# Patient Record
Sex: Male | Born: 1970 | Race: White | Hispanic: No | Smoking: Current every day smoker
Health system: Southern US, Community
[De-identification: ages and names within clinical notes are randomized; demographics above are authoritative.]

## PROBLEM LIST (undated history)

## (undated) DIAGNOSIS — F4310 Post-traumatic stress disorder, unspecified: Secondary | ICD-10-CM

## (undated) DIAGNOSIS — R413 Other amnesia: Secondary | ICD-10-CM

## (undated) DIAGNOSIS — Z87442 Personal history of urinary calculi: Secondary | ICD-10-CM

## (undated) DIAGNOSIS — E119 Type 2 diabetes mellitus without complications: Secondary | ICD-10-CM

## (undated) DIAGNOSIS — F419 Anxiety disorder, unspecified: Secondary | ICD-10-CM

## (undated) DIAGNOSIS — S069XAA Unspecified intracranial injury with loss of consciousness status unknown, initial encounter: Secondary | ICD-10-CM

## (undated) DIAGNOSIS — L403 Pustulosis palmaris et plantaris: Secondary | ICD-10-CM

## (undated) DIAGNOSIS — K219 Gastro-esophageal reflux disease without esophagitis: Secondary | ICD-10-CM

## (undated) DIAGNOSIS — D649 Anemia, unspecified: Secondary | ICD-10-CM

## (undated) DIAGNOSIS — Z972 Presence of dental prosthetic device (complete) (partial): Secondary | ICD-10-CM

## (undated) DIAGNOSIS — R2681 Unsteadiness on feet: Secondary | ICD-10-CM

## (undated) DIAGNOSIS — C4490 Unspecified malignant neoplasm of skin, unspecified: Secondary | ICD-10-CM

## (undated) DIAGNOSIS — G40409 Other generalized epilepsy and epileptic syndromes, not intractable, without status epilepticus: Secondary | ICD-10-CM

## (undated) DIAGNOSIS — R2689 Other abnormalities of gait and mobility: Secondary | ICD-10-CM

## (undated) DIAGNOSIS — S069X9A Unspecified intracranial injury with loss of consciousness of unspecified duration, initial encounter: Secondary | ICD-10-CM

## (undated) DIAGNOSIS — G40A01 Absence epileptic syndrome, not intractable, with status epilepticus: Secondary | ICD-10-CM

## (undated) DIAGNOSIS — G43909 Migraine, unspecified, not intractable, without status migrainosus: Secondary | ICD-10-CM

## (undated) DIAGNOSIS — S04019A Injury of optic nerve, unspecified eye, initial encounter: Secondary | ICD-10-CM

## (undated) HISTORY — PX: BRAIN SURGERY: SHX531

## (undated) HISTORY — PX: FACIAL RECONSTRUCTION SURGERY: SHX631

---

## 2012-07-08 DIAGNOSIS — C4490 Unspecified malignant neoplasm of skin, unspecified: Secondary | ICD-10-CM

## 2012-07-08 HISTORY — PX: SKIN CANCER EXCISION: SHX779

## 2012-07-08 HISTORY — DX: Unspecified malignant neoplasm of skin, unspecified: C44.90

## 2015-12-07 DIAGNOSIS — G40309 Generalized idiopathic epilepsy and epileptic syndromes, not intractable, without status epilepticus: Secondary | ICD-10-CM | POA: Diagnosis not present

## 2015-12-07 DIAGNOSIS — E119 Type 2 diabetes mellitus without complications: Secondary | ICD-10-CM | POA: Diagnosis not present

## 2015-12-07 DIAGNOSIS — F325 Major depressive disorder, single episode, in full remission: Secondary | ICD-10-CM | POA: Diagnosis not present

## 2015-12-07 DIAGNOSIS — E782 Mixed hyperlipidemia: Secondary | ICD-10-CM | POA: Diagnosis not present

## 2015-12-07 DIAGNOSIS — E784 Other hyperlipidemia: Secondary | ICD-10-CM | POA: Diagnosis not present

## 2016-03-18 DIAGNOSIS — G40309 Generalized idiopathic epilepsy and epileptic syndromes, not intractable, without status epilepticus: Secondary | ICD-10-CM | POA: Diagnosis not present

## 2016-03-18 DIAGNOSIS — E119 Type 2 diabetes mellitus without complications: Secondary | ICD-10-CM | POA: Diagnosis not present

## 2016-03-18 DIAGNOSIS — E784 Other hyperlipidemia: Secondary | ICD-10-CM | POA: Diagnosis not present

## 2016-03-18 DIAGNOSIS — E782 Mixed hyperlipidemia: Secondary | ICD-10-CM | POA: Diagnosis not present

## 2016-03-18 DIAGNOSIS — F325 Major depressive disorder, single episode, in full remission: Secondary | ICD-10-CM | POA: Diagnosis not present

## 2016-03-18 DIAGNOSIS — Z23 Encounter for immunization: Secondary | ICD-10-CM | POA: Diagnosis not present

## 2016-05-22 DIAGNOSIS — J060 Acute laryngopharyngitis: Secondary | ICD-10-CM | POA: Diagnosis not present

## 2016-08-13 DIAGNOSIS — R0602 Shortness of breath: Secondary | ICD-10-CM | POA: Diagnosis not present

## 2016-08-13 DIAGNOSIS — F17218 Nicotine dependence, cigarettes, with other nicotine-induced disorders: Secondary | ICD-10-CM | POA: Diagnosis not present

## 2016-10-22 DIAGNOSIS — R21 Rash and other nonspecific skin eruption: Secondary | ICD-10-CM | POA: Diagnosis not present

## 2017-01-14 DIAGNOSIS — R221 Localized swelling, mass and lump, neck: Secondary | ICD-10-CM | POA: Diagnosis not present

## 2017-01-14 DIAGNOSIS — K119 Disease of salivary gland, unspecified: Secondary | ICD-10-CM | POA: Diagnosis not present

## 2017-01-20 DIAGNOSIS — K029 Dental caries, unspecified: Secondary | ICD-10-CM | POA: Diagnosis not present

## 2017-01-20 DIAGNOSIS — J342 Deviated nasal septum: Secondary | ICD-10-CM | POA: Diagnosis not present

## 2017-01-20 DIAGNOSIS — H6121 Impacted cerumen, right ear: Secondary | ICD-10-CM | POA: Diagnosis not present

## 2017-01-20 DIAGNOSIS — R221 Localized swelling, mass and lump, neck: Secondary | ICD-10-CM | POA: Diagnosis not present

## 2017-01-20 DIAGNOSIS — F17200 Nicotine dependence, unspecified, uncomplicated: Secondary | ICD-10-CM | POA: Diagnosis not present

## 2017-01-20 DIAGNOSIS — D37030 Neoplasm of uncertain behavior of the parotid salivary glands: Secondary | ICD-10-CM | POA: Diagnosis not present

## 2017-02-03 DIAGNOSIS — K118 Other diseases of salivary glands: Secondary | ICD-10-CM | POA: Diagnosis not present

## 2017-02-03 DIAGNOSIS — K119 Disease of salivary gland, unspecified: Secondary | ICD-10-CM | POA: Diagnosis not present

## 2017-02-03 DIAGNOSIS — D37030 Neoplasm of uncertain behavior of the parotid salivary glands: Secondary | ICD-10-CM | POA: Diagnosis not present

## 2017-02-25 DIAGNOSIS — D119 Benign neoplasm of major salivary gland, unspecified: Secondary | ICD-10-CM | POA: Diagnosis not present

## 2017-02-25 DIAGNOSIS — F17200 Nicotine dependence, unspecified, uncomplicated: Secondary | ICD-10-CM | POA: Diagnosis not present

## 2017-03-05 DIAGNOSIS — T7840XA Allergy, unspecified, initial encounter: Secondary | ICD-10-CM | POA: Diagnosis not present

## 2017-03-06 DIAGNOSIS — D119 Benign neoplasm of major salivary gland, unspecified: Secondary | ICD-10-CM | POA: Diagnosis not present

## 2017-03-12 DIAGNOSIS — Z7721 Contact with and (suspected) exposure to potentially hazardous body fluids: Secondary | ICD-10-CM | POA: Diagnosis not present

## 2017-03-12 DIAGNOSIS — Z202 Contact with and (suspected) exposure to infections with a predominantly sexual mode of transmission: Secondary | ICD-10-CM | POA: Diagnosis not present

## 2017-03-12 DIAGNOSIS — R21 Rash and other nonspecific skin eruption: Secondary | ICD-10-CM | POA: Diagnosis not present

## 2017-03-12 DIAGNOSIS — Z23 Encounter for immunization: Secondary | ICD-10-CM | POA: Diagnosis not present

## 2017-03-31 DIAGNOSIS — E119 Type 2 diabetes mellitus without complications: Secondary | ICD-10-CM | POA: Diagnosis not present

## 2017-03-31 DIAGNOSIS — E782 Mixed hyperlipidemia: Secondary | ICD-10-CM | POA: Diagnosis not present

## 2017-03-31 DIAGNOSIS — R799 Abnormal finding of blood chemistry, unspecified: Secondary | ICD-10-CM | POA: Diagnosis not present

## 2017-03-31 DIAGNOSIS — R5383 Other fatigue: Secondary | ICD-10-CM | POA: Diagnosis not present

## 2017-03-31 DIAGNOSIS — F325 Major depressive disorder, single episode, in full remission: Secondary | ICD-10-CM | POA: Diagnosis not present

## 2017-03-31 DIAGNOSIS — G40309 Generalized idiopathic epilepsy and epileptic syndromes, not intractable, without status epilepticus: Secondary | ICD-10-CM | POA: Diagnosis not present

## 2017-04-11 DIAGNOSIS — D119 Benign neoplasm of major salivary gland, unspecified: Secondary | ICD-10-CM | POA: Diagnosis not present

## 2017-04-11 DIAGNOSIS — F17200 Nicotine dependence, unspecified, uncomplicated: Secondary | ICD-10-CM | POA: Diagnosis not present

## 2017-04-15 DIAGNOSIS — R799 Abnormal finding of blood chemistry, unspecified: Secondary | ICD-10-CM | POA: Diagnosis not present

## 2017-04-15 DIAGNOSIS — E119 Type 2 diabetes mellitus without complications: Secondary | ICD-10-CM | POA: Diagnosis not present

## 2017-04-15 DIAGNOSIS — E782 Mixed hyperlipidemia: Secondary | ICD-10-CM | POA: Diagnosis not present

## 2017-04-25 DIAGNOSIS — J069 Acute upper respiratory infection, unspecified: Secondary | ICD-10-CM | POA: Diagnosis not present

## 2017-04-25 DIAGNOSIS — J209 Acute bronchitis, unspecified: Secondary | ICD-10-CM | POA: Diagnosis not present

## 2017-04-25 DIAGNOSIS — J029 Acute pharyngitis, unspecified: Secondary | ICD-10-CM | POA: Diagnosis not present

## 2017-05-05 DIAGNOSIS — D119 Benign neoplasm of major salivary gland, unspecified: Secondary | ICD-10-CM | POA: Diagnosis not present

## 2017-05-05 DIAGNOSIS — F17200 Nicotine dependence, unspecified, uncomplicated: Secondary | ICD-10-CM | POA: Diagnosis not present

## 2017-05-08 DIAGNOSIS — R05 Cough: Secondary | ICD-10-CM | POA: Diagnosis not present

## 2017-05-08 DIAGNOSIS — J0100 Acute maxillary sinusitis, unspecified: Secondary | ICD-10-CM | POA: Diagnosis not present

## 2017-05-08 HISTORY — PX: FACIAL BONE TUMOR EXCISION: SUR565

## 2017-05-14 DIAGNOSIS — D110 Benign neoplasm of parotid gland: Secondary | ICD-10-CM | POA: Diagnosis not present

## 2017-05-14 DIAGNOSIS — D490 Neoplasm of unspecified behavior of digestive system: Secondary | ICD-10-CM | POA: Diagnosis not present

## 2017-05-14 DIAGNOSIS — F17210 Nicotine dependence, cigarettes, uncomplicated: Secondary | ICD-10-CM | POA: Diagnosis not present

## 2017-05-14 DIAGNOSIS — D119 Benign neoplasm of major salivary gland, unspecified: Secondary | ICD-10-CM | POA: Diagnosis not present

## 2017-05-28 DIAGNOSIS — Z87820 Personal history of traumatic brain injury: Secondary | ICD-10-CM | POA: Diagnosis not present

## 2017-05-28 DIAGNOSIS — S04011A Injury of optic nerve, right eye, initial encounter: Secondary | ICD-10-CM | POA: Diagnosis not present

## 2017-05-28 DIAGNOSIS — H52223 Regular astigmatism, bilateral: Secondary | ICD-10-CM | POA: Diagnosis not present

## 2017-05-28 DIAGNOSIS — H5213 Myopia, bilateral: Secondary | ICD-10-CM | POA: Diagnosis not present

## 2017-05-28 DIAGNOSIS — S04012A Injury of optic nerve, left eye, initial encounter: Secondary | ICD-10-CM | POA: Diagnosis not present

## 2017-05-28 DIAGNOSIS — H524 Presbyopia: Secondary | ICD-10-CM | POA: Diagnosis not present

## 2017-06-09 DIAGNOSIS — F4312 Post-traumatic stress disorder, chronic: Secondary | ICD-10-CM | POA: Diagnosis not present

## 2017-06-09 DIAGNOSIS — K047 Periapical abscess without sinus: Secondary | ICD-10-CM | POA: Diagnosis not present

## 2017-06-26 DIAGNOSIS — R561 Post traumatic seizures: Secondary | ICD-10-CM | POA: Diagnosis not present

## 2017-06-26 DIAGNOSIS — F4310 Post-traumatic stress disorder, unspecified: Secondary | ICD-10-CM | POA: Diagnosis not present

## 2017-06-26 DIAGNOSIS — Z87820 Personal history of traumatic brain injury: Secondary | ICD-10-CM | POA: Diagnosis not present

## 2017-06-26 DIAGNOSIS — G4700 Insomnia, unspecified: Secondary | ICD-10-CM | POA: Diagnosis not present

## 2017-06-26 DIAGNOSIS — F419 Anxiety disorder, unspecified: Secondary | ICD-10-CM | POA: Diagnosis not present

## 2017-07-02 DIAGNOSIS — G40209 Localization-related (focal) (partial) symptomatic epilepsy and epileptic syndromes with complex partial seizures, not intractable, without status epilepticus: Secondary | ICD-10-CM | POA: Diagnosis not present

## 2017-07-02 DIAGNOSIS — G9349 Other encephalopathy: Secondary | ICD-10-CM | POA: Diagnosis not present

## 2017-07-02 DIAGNOSIS — S069X9S Unspecified intracranial injury with loss of consciousness of unspecified duration, sequela: Secondary | ICD-10-CM | POA: Diagnosis not present

## 2017-07-17 DIAGNOSIS — A539 Syphilis, unspecified: Secondary | ICD-10-CM | POA: Diagnosis not present

## 2017-07-17 DIAGNOSIS — Z1379 Encounter for other screening for genetic and chromosomal anomalies: Secondary | ICD-10-CM | POA: Diagnosis not present

## 2017-07-17 DIAGNOSIS — F419 Anxiety disorder, unspecified: Secondary | ICD-10-CM | POA: Diagnosis not present

## 2017-07-17 DIAGNOSIS — F909 Attention-deficit hyperactivity disorder, unspecified type: Secondary | ICD-10-CM | POA: Diagnosis not present

## 2017-07-17 DIAGNOSIS — F339 Major depressive disorder, recurrent, unspecified: Secondary | ICD-10-CM | POA: Diagnosis not present

## 2017-07-17 DIAGNOSIS — Z1159 Encounter for screening for other viral diseases: Secondary | ICD-10-CM | POA: Diagnosis not present

## 2017-07-22 DIAGNOSIS — I8310 Varicose veins of unspecified lower extremity with inflammation: Secondary | ICD-10-CM | POA: Diagnosis not present

## 2017-07-22 DIAGNOSIS — L578 Other skin changes due to chronic exposure to nonionizing radiation: Secondary | ICD-10-CM | POA: Diagnosis not present

## 2017-07-22 DIAGNOSIS — Z1379 Encounter for other screening for genetic and chromosomal anomalies: Secondary | ICD-10-CM | POA: Diagnosis not present

## 2017-07-22 DIAGNOSIS — L400 Psoriasis vulgaris: Secondary | ICD-10-CM | POA: Diagnosis not present

## 2017-07-22 DIAGNOSIS — Z8042 Family history of malignant neoplasm of prostate: Secondary | ICD-10-CM | POA: Diagnosis not present

## 2017-07-22 DIAGNOSIS — G4700 Insomnia, unspecified: Secondary | ICD-10-CM | POA: Diagnosis not present

## 2017-07-22 DIAGNOSIS — L401 Generalized pustular psoriasis: Secondary | ICD-10-CM | POA: Diagnosis not present

## 2017-07-22 DIAGNOSIS — F419 Anxiety disorder, unspecified: Secondary | ICD-10-CM | POA: Diagnosis not present

## 2017-07-22 DIAGNOSIS — R561 Post traumatic seizures: Secondary | ICD-10-CM | POA: Diagnosis not present

## 2017-07-22 DIAGNOSIS — Z803 Family history of malignant neoplasm of breast: Secondary | ICD-10-CM | POA: Diagnosis not present

## 2017-07-25 DIAGNOSIS — G9349 Other encephalopathy: Secondary | ICD-10-CM | POA: Diagnosis not present

## 2017-07-25 DIAGNOSIS — G40209 Localization-related (focal) (partial) symptomatic epilepsy and epileptic syndromes with complex partial seizures, not intractable, without status epilepticus: Secondary | ICD-10-CM | POA: Diagnosis not present

## 2017-07-25 DIAGNOSIS — S069X9S Unspecified intracranial injury with loss of consciousness of unspecified duration, sequela: Secondary | ICD-10-CM | POA: Diagnosis not present

## 2017-07-30 DIAGNOSIS — F422 Mixed obsessional thoughts and acts: Secondary | ICD-10-CM | POA: Diagnosis not present

## 2017-08-07 DIAGNOSIS — R079 Chest pain, unspecified: Secondary | ICD-10-CM | POA: Diagnosis not present

## 2017-08-07 DIAGNOSIS — R05 Cough: Secondary | ICD-10-CM | POA: Diagnosis not present

## 2017-08-07 DIAGNOSIS — M25549 Pain in joints of unspecified hand: Secondary | ICD-10-CM | POA: Diagnosis not present

## 2017-08-07 DIAGNOSIS — L401 Generalized pustular psoriasis: Secondary | ICD-10-CM | POA: Diagnosis not present

## 2017-08-07 DIAGNOSIS — F17210 Nicotine dependence, cigarettes, uncomplicated: Secondary | ICD-10-CM | POA: Diagnosis not present

## 2017-08-19 DIAGNOSIS — F17210 Nicotine dependence, cigarettes, uncomplicated: Secondary | ICD-10-CM | POA: Diagnosis not present

## 2017-08-19 DIAGNOSIS — Z0001 Encounter for general adult medical examination with abnormal findings: Secondary | ICD-10-CM | POA: Diagnosis not present

## 2017-08-19 DIAGNOSIS — R531 Weakness: Secondary | ICD-10-CM | POA: Diagnosis not present

## 2017-08-19 DIAGNOSIS — R5383 Other fatigue: Secondary | ICD-10-CM | POA: Diagnosis not present

## 2017-08-19 DIAGNOSIS — E1165 Type 2 diabetes mellitus with hyperglycemia: Secondary | ICD-10-CM | POA: Diagnosis not present

## 2017-08-19 DIAGNOSIS — R05 Cough: Secondary | ICD-10-CM | POA: Diagnosis not present

## 2017-08-19 DIAGNOSIS — F4310 Post-traumatic stress disorder, unspecified: Secondary | ICD-10-CM | POA: Diagnosis not present

## 2017-08-19 DIAGNOSIS — F419 Anxiety disorder, unspecified: Secondary | ICD-10-CM | POA: Diagnosis not present

## 2017-08-20 DIAGNOSIS — E1165 Type 2 diabetes mellitus with hyperglycemia: Secondary | ICD-10-CM | POA: Diagnosis not present

## 2017-08-26 DIAGNOSIS — L400 Psoriasis vulgaris: Secondary | ICD-10-CM | POA: Diagnosis not present

## 2017-08-26 DIAGNOSIS — L570 Actinic keratosis: Secondary | ICD-10-CM | POA: Diagnosis not present

## 2017-08-27 DIAGNOSIS — F422 Mixed obsessional thoughts and acts: Secondary | ICD-10-CM | POA: Diagnosis not present

## 2017-09-04 DIAGNOSIS — D509 Iron deficiency anemia, unspecified: Secondary | ICD-10-CM | POA: Diagnosis not present

## 2017-09-04 DIAGNOSIS — E1165 Type 2 diabetes mellitus with hyperglycemia: Secondary | ICD-10-CM | POA: Diagnosis not present

## 2017-09-04 DIAGNOSIS — D519 Vitamin B12 deficiency anemia, unspecified: Secondary | ICD-10-CM | POA: Diagnosis not present

## 2017-09-17 DIAGNOSIS — S04012A Injury of optic nerve, left eye, initial encounter: Secondary | ICD-10-CM | POA: Diagnosis not present

## 2017-09-17 DIAGNOSIS — Z87820 Personal history of traumatic brain injury: Secondary | ICD-10-CM | POA: Diagnosis not present

## 2017-09-17 DIAGNOSIS — H53453 Other localized visual field defect, bilateral: Secondary | ICD-10-CM | POA: Diagnosis not present

## 2017-09-17 DIAGNOSIS — H5203 Hypermetropia, bilateral: Secondary | ICD-10-CM | POA: Diagnosis not present

## 2017-09-17 DIAGNOSIS — S04011A Injury of optic nerve, right eye, initial encounter: Secondary | ICD-10-CM | POA: Diagnosis not present

## 2017-09-17 DIAGNOSIS — H52223 Regular astigmatism, bilateral: Secondary | ICD-10-CM | POA: Diagnosis not present

## 2017-09-17 DIAGNOSIS — H524 Presbyopia: Secondary | ICD-10-CM | POA: Diagnosis not present

## 2017-09-18 DIAGNOSIS — E1165 Type 2 diabetes mellitus with hyperglycemia: Secondary | ICD-10-CM | POA: Diagnosis not present

## 2017-09-18 DIAGNOSIS — R799 Abnormal finding of blood chemistry, unspecified: Secondary | ICD-10-CM | POA: Diagnosis not present

## 2017-09-29 DIAGNOSIS — F422 Mixed obsessional thoughts and acts: Secondary | ICD-10-CM | POA: Diagnosis not present

## 2017-10-06 DIAGNOSIS — F422 Mixed obsessional thoughts and acts: Secondary | ICD-10-CM | POA: Diagnosis not present

## 2017-10-30 DIAGNOSIS — Z87820 Personal history of traumatic brain injury: Secondary | ICD-10-CM | POA: Diagnosis not present

## 2017-10-30 DIAGNOSIS — R569 Unspecified convulsions: Secondary | ICD-10-CM | POA: Diagnosis not present

## 2017-10-30 DIAGNOSIS — F17210 Nicotine dependence, cigarettes, uncomplicated: Secondary | ICD-10-CM | POA: Diagnosis not present

## 2017-10-30 DIAGNOSIS — Z79899 Other long term (current) drug therapy: Secondary | ICD-10-CM | POA: Diagnosis not present

## 2017-11-10 DIAGNOSIS — F422 Mixed obsessional thoughts and acts: Secondary | ICD-10-CM | POA: Diagnosis not present

## 2017-11-25 DIAGNOSIS — L570 Actinic keratosis: Secondary | ICD-10-CM | POA: Diagnosis not present

## 2017-11-25 DIAGNOSIS — L720 Epidermal cyst: Secondary | ICD-10-CM | POA: Diagnosis not present

## 2017-11-25 DIAGNOSIS — L400 Psoriasis vulgaris: Secondary | ICD-10-CM | POA: Diagnosis not present

## 2017-11-25 DIAGNOSIS — B079 Viral wart, unspecified: Secondary | ICD-10-CM | POA: Diagnosis not present

## 2017-12-06 HISTORY — PX: DENTAL SURGERY: SHX609

## 2018-01-27 DIAGNOSIS — R05 Cough: Secondary | ICD-10-CM | POA: Diagnosis not present

## 2018-01-27 DIAGNOSIS — E119 Type 2 diabetes mellitus without complications: Secondary | ICD-10-CM | POA: Diagnosis not present

## 2018-01-27 DIAGNOSIS — E785 Hyperlipidemia, unspecified: Secondary | ICD-10-CM | POA: Diagnosis not present

## 2018-01-27 DIAGNOSIS — R634 Abnormal weight loss: Secondary | ICD-10-CM | POA: Diagnosis not present

## 2018-01-27 DIAGNOSIS — Z87820 Personal history of traumatic brain injury: Secondary | ICD-10-CM | POA: Diagnosis not present

## 2018-01-27 DIAGNOSIS — F419 Anxiety disorder, unspecified: Secondary | ICD-10-CM | POA: Diagnosis not present

## 2018-01-27 DIAGNOSIS — Z125 Encounter for screening for malignant neoplasm of prostate: Secondary | ICD-10-CM | POA: Diagnosis not present

## 2018-02-02 DIAGNOSIS — F422 Mixed obsessional thoughts and acts: Secondary | ICD-10-CM | POA: Diagnosis not present

## 2018-02-26 DIAGNOSIS — E119 Type 2 diabetes mellitus without complications: Secondary | ICD-10-CM | POA: Diagnosis not present

## 2018-02-26 DIAGNOSIS — E871 Hypo-osmolality and hyponatremia: Secondary | ICD-10-CM | POA: Diagnosis not present

## 2018-03-05 DIAGNOSIS — F17210 Nicotine dependence, cigarettes, uncomplicated: Secondary | ICD-10-CM | POA: Diagnosis not present

## 2018-03-05 DIAGNOSIS — Z9049 Acquired absence of other specified parts of digestive tract: Secondary | ICD-10-CM | POA: Diagnosis not present

## 2018-03-19 DIAGNOSIS — R100 Acute abdomen: Secondary | ICD-10-CM | POA: Diagnosis not present

## 2018-03-19 DIAGNOSIS — K5900 Constipation, unspecified: Secondary | ICD-10-CM | POA: Diagnosis not present

## 2018-03-23 DIAGNOSIS — Z79899 Other long term (current) drug therapy: Secondary | ICD-10-CM | POA: Diagnosis not present

## 2018-03-23 DIAGNOSIS — E119 Type 2 diabetes mellitus without complications: Secondary | ICD-10-CM | POA: Diagnosis not present

## 2018-03-23 DIAGNOSIS — F17210 Nicotine dependence, cigarettes, uncomplicated: Secondary | ICD-10-CM | POA: Diagnosis not present

## 2018-03-23 DIAGNOSIS — N200 Calculus of kidney: Secondary | ICD-10-CM | POA: Diagnosis not present

## 2018-03-23 DIAGNOSIS — N201 Calculus of ureter: Secondary | ICD-10-CM | POA: Diagnosis not present

## 2018-03-24 DIAGNOSIS — N200 Calculus of kidney: Secondary | ICD-10-CM | POA: Diagnosis not present

## 2018-03-25 ENCOUNTER — Other Ambulatory Visit: Payer: Self-pay | Admitting: Urology

## 2018-04-08 ENCOUNTER — Encounter (HOSPITAL_BASED_OUTPATIENT_CLINIC_OR_DEPARTMENT_OTHER): Payer: Self-pay

## 2018-04-08 ENCOUNTER — Other Ambulatory Visit: Payer: Self-pay

## 2018-04-08 NOTE — Pre-Procedure Instructions (Signed)
Left a voicemail with Pam at Dr. Carlton Adam office to inform them of the several unsuccessful attempts to reach Randy Richards for pre op interview.  I wanted to see if they had any additional phone numbers I could reach him.

## 2018-04-08 NOTE — Progress Notes (Signed)
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

## 2018-04-09 ENCOUNTER — Ambulatory Visit (HOSPITAL_BASED_OUTPATIENT_CLINIC_OR_DEPARTMENT_OTHER)
Admission: RE | Admit: 2018-04-09 | Discharge: 2018-04-09 | Disposition: A | Discharge status: Home / Self Care | Payer: PPO | Source: Ambulatory Visit | Attending: Urology | Admitting: Urology

## 2018-04-09 ENCOUNTER — Ambulatory Visit (HOSPITAL_BASED_OUTPATIENT_CLINIC_OR_DEPARTMENT_OTHER): Payer: PPO | Admitting: Anesthesiology

## 2018-04-09 ENCOUNTER — Encounter (HOSPITAL_BASED_OUTPATIENT_CLINIC_OR_DEPARTMENT_OTHER): Payer: Self-pay

## 2018-04-09 ENCOUNTER — Encounter (HOSPITAL_BASED_OUTPATIENT_CLINIC_OR_DEPARTMENT_OTHER)
Admission: RE | Disposition: A | Discharge status: Home / Self Care | Payer: Self-pay | Source: Ambulatory Visit | Attending: Urology

## 2018-04-09 DIAGNOSIS — E119 Type 2 diabetes mellitus without complications: Secondary | ICD-10-CM | POA: Insufficient documentation

## 2018-04-09 DIAGNOSIS — F329 Major depressive disorder, single episode, unspecified: Secondary | ICD-10-CM | POA: Diagnosis not present

## 2018-04-09 DIAGNOSIS — Z882 Allergy status to sulfonamides status: Secondary | ICD-10-CM | POA: Insufficient documentation

## 2018-04-09 DIAGNOSIS — F419 Anxiety disorder, unspecified: Secondary | ICD-10-CM | POA: Insufficient documentation

## 2018-04-09 DIAGNOSIS — M1990 Unspecified osteoarthritis, unspecified site: Secondary | ICD-10-CM | POA: Diagnosis not present

## 2018-04-09 DIAGNOSIS — N202 Calculus of kidney with calculus of ureter: Secondary | ICD-10-CM | POA: Diagnosis present

## 2018-04-09 DIAGNOSIS — Z8042 Family history of malignant neoplasm of prostate: Secondary | ICD-10-CM | POA: Insufficient documentation

## 2018-04-09 DIAGNOSIS — G40909 Epilepsy, unspecified, not intractable, without status epilepticus: Secondary | ICD-10-CM | POA: Diagnosis not present

## 2018-04-09 DIAGNOSIS — D649 Anemia, unspecified: Secondary | ICD-10-CM | POA: Diagnosis not present

## 2018-04-09 DIAGNOSIS — N200 Calculus of kidney: Secondary | ICD-10-CM | POA: Diagnosis not present

## 2018-04-09 DIAGNOSIS — Z7982 Long term (current) use of aspirin: Secondary | ICD-10-CM | POA: Insufficient documentation

## 2018-04-09 DIAGNOSIS — Z841 Family history of disorders of kidney and ureter: Secondary | ICD-10-CM | POA: Insufficient documentation

## 2018-04-09 DIAGNOSIS — Z79899 Other long term (current) drug therapy: Secondary | ICD-10-CM | POA: Diagnosis not present

## 2018-04-09 HISTORY — DX: Post-traumatic stress disorder, unspecified: F43.10

## 2018-04-09 HISTORY — DX: Pustulosis palmaris et plantaris: L40.3

## 2018-04-09 HISTORY — DX: Anemia, unspecified: D64.9

## 2018-04-09 HISTORY — DX: Anxiety disorder, unspecified: F41.9

## 2018-04-09 HISTORY — DX: Migraine, unspecified, not intractable, without status migrainosus: G43.909

## 2018-04-09 HISTORY — DX: Unspecified malignant neoplasm of skin, unspecified: C44.90

## 2018-04-09 HISTORY — DX: Unspecified intracranial injury with loss of consciousness status unknown, initial encounter: S06.9XAA

## 2018-04-09 HISTORY — DX: Personal history of urinary calculi: Z87.442

## 2018-04-09 HISTORY — DX: Unsteadiness on feet: R26.81

## 2018-04-09 HISTORY — DX: Presence of dental prosthetic device (complete) (partial): Z97.2

## 2018-04-09 HISTORY — DX: Gastro-esophageal reflux disease without esophagitis: K21.9

## 2018-04-09 HISTORY — DX: Type 2 diabetes mellitus without complications: E11.9

## 2018-04-09 HISTORY — DX: Other generalized epilepsy and epileptic syndromes, not intractable, without status epilepticus: G40.409

## 2018-04-09 HISTORY — DX: Other amnesia: R41.3

## 2018-04-09 HISTORY — DX: Absence epileptic syndrome, not intractable, with status epilepticus: G40.A01

## 2018-04-09 HISTORY — DX: Unspecified intracranial injury with loss of consciousness of unspecified duration, initial encounter: S06.9X9A

## 2018-04-09 HISTORY — DX: Injury of optic nerve, unspecified eye, initial encounter: S04.019A

## 2018-04-09 HISTORY — DX: Other abnormalities of gait and mobility: R26.89

## 2018-04-09 HISTORY — PX: CYSTOSCOPY WITH RETROGRADE PYELOGRAM, URETEROSCOPY AND STENT PLACEMENT: SHX5789

## 2018-04-09 LAB — POCT I-STAT 4, (NA,K, GLUC, HGB,HCT)
Glucose, Bld: 116 mg/dL — ABNORMAL HIGH (ref 70–99)
HEMATOCRIT: 39 % (ref 39.0–52.0)
Hemoglobin: 13.3 g/dL (ref 13.0–17.0)
Potassium: 4.6 mmol/L (ref 3.5–5.1)
Sodium: 134 mmol/L — ABNORMAL LOW (ref 135–145)

## 2018-04-09 LAB — GLUCOSE, CAPILLARY: Glucose-Capillary: 129 mg/dL — ABNORMAL HIGH (ref 70–99)

## 2018-04-09 SURGERY — CYSTOURETEROSCOPY, WITH RETROGRADE PYELOGRAM AND STENT INSERTION
Anesthesia: General | Site: Bladder | Laterality: Right

## 2018-04-09 MED ORDER — LIDOCAINE HCL (CARDIAC) PF 100 MG/5ML IV SOSY
PREFILLED_SYRINGE | INTRAVENOUS | Status: DC | PRN
  Administered 2018-04-09: 100 mg via INTRAVENOUS

## 2018-04-09 MED ORDER — PROMETHAZINE HCL 25 MG/ML IJ SOLN
6.2500 mg | INTRAMUSCULAR | Status: DC | PRN
  Filled 2018-04-09: qty 1

## 2018-04-09 MED ORDER — PHENAZOPYRIDINE HCL 200 MG PO TABS: 200.0000 mg | ORAL_TABLET | Freq: Three times a day (TID) | ORAL | 0 refills | Status: DC | PRN

## 2018-04-09 MED ORDER — IOHEXOL 300 MG/ML  SOLN
INTRAMUSCULAR | Status: DC | PRN
  Administered 2018-04-09: 10 mL via INTRAVENOUS

## 2018-04-09 MED ORDER — FENTANYL CITRATE (PF) 100 MCG/2ML IJ SOLN
25.0000 ug | INTRAMUSCULAR | Status: DC | PRN
  Filled 2018-04-09: qty 1

## 2018-04-09 MED ORDER — DEXAMETHASONE SODIUM PHOSPHATE 4 MG/ML IJ SOLN
INTRAMUSCULAR | Status: DC | PRN
  Administered 2018-04-09: 10 mg via INTRAVENOUS

## 2018-04-09 MED ORDER — CEFAZOLIN SODIUM-DEXTROSE 2-4 GM/100ML-% IV SOLN
INTRAVENOUS | Status: AC
  Filled 2018-04-09: qty 100

## 2018-04-09 MED ORDER — ONDANSETRON HCL 4 MG/2ML IJ SOLN
INTRAMUSCULAR | Status: DC | PRN
  Administered 2018-04-09: 4 mg via INTRAVENOUS

## 2018-04-09 MED ORDER — KETOROLAC TROMETHAMINE 30 MG/ML IJ SOLN
INTRAMUSCULAR | Status: DC | PRN
  Administered 2018-04-09: 30 mg via INTRAVENOUS

## 2018-04-09 MED ORDER — OXYCODONE HCL 5 MG PO TABS
5.0000 mg | ORAL_TABLET | Freq: Once | ORAL | Status: DC | PRN
  Filled 2018-04-09: qty 1

## 2018-04-09 MED ORDER — LACTATED RINGERS IV SOLN
INTRAVENOUS | Status: DC
  Administered 2018-04-09 (×3): via INTRAVENOUS
  Filled 2018-04-09: qty 1000

## 2018-04-09 MED ORDER — PROPOFOL 10 MG/ML IV BOLUS
INTRAVENOUS | Status: DC | PRN
  Administered 2018-04-09: 200 mg via INTRAVENOUS

## 2018-04-09 MED ORDER — FENTANYL CITRATE (PF) 100 MCG/2ML IJ SOLN
INTRAMUSCULAR | Status: DC | PRN
  Administered 2018-04-09 (×8): 25 ug via INTRAVENOUS

## 2018-04-09 MED ORDER — MIDAZOLAM HCL 5 MG/5ML IJ SOLN
INTRAMUSCULAR | Status: DC | PRN
  Administered 2018-04-09: 2 mg via INTRAVENOUS

## 2018-04-09 MED ORDER — MIDAZOLAM HCL 2 MG/2ML IJ SOLN
INTRAMUSCULAR | Status: AC
  Filled 2018-04-09: qty 2

## 2018-04-09 MED ORDER — LIDOCAINE 2% (20 MG/ML) 5 ML SYRINGE
INTRAMUSCULAR | Status: AC
  Filled 2018-04-09: qty 5

## 2018-04-09 MED ORDER — DEXAMETHASONE SODIUM PHOSPHATE 10 MG/ML IJ SOLN
INTRAMUSCULAR | Status: AC
  Filled 2018-04-09: qty 1

## 2018-04-09 MED ORDER — ONDANSETRON HCL 4 MG/2ML IJ SOLN
INTRAMUSCULAR | Status: AC
  Filled 2018-04-09: qty 2

## 2018-04-09 MED ORDER — KETOROLAC TROMETHAMINE 30 MG/ML IJ SOLN
INTRAMUSCULAR | Status: AC
  Filled 2018-04-09: qty 1

## 2018-04-09 MED ORDER — TRAMADOL HCL 50 MG PO TABS: 50.0000 mg | ORAL_TABLET | Freq: Four times a day (QID) | ORAL | 0 refills | Status: DC | PRN

## 2018-04-09 MED ORDER — PROPOFOL 10 MG/ML IV BOLUS
INTRAVENOUS | Status: AC
  Filled 2018-04-09: qty 40

## 2018-04-09 MED ORDER — OXYCODONE HCL 5 MG/5ML PO SOLN
5.0000 mg | Freq: Once | ORAL | Status: DC | PRN
  Filled 2018-04-09: qty 5

## 2018-04-09 MED ORDER — FENTANYL CITRATE (PF) 100 MCG/2ML IJ SOLN
INTRAMUSCULAR | Status: AC
  Filled 2018-04-09: qty 2

## 2018-04-09 MED ORDER — SODIUM CHLORIDE 0.9 % IR SOLN
Status: DC | PRN
  Administered 2018-04-09: 3000 mL via INTRAVESICAL

## 2018-04-09 MED ORDER — BELLADONNA ALKALOIDS-OPIUM 16.2-60 MG RE SUPP
RECTAL | Status: AC
  Filled 2018-04-09: qty 1

## 2018-04-09 MED ORDER — CEFAZOLIN SODIUM-DEXTROSE 2-4 GM/100ML-% IV SOLN
2.0000 g | INTRAVENOUS | Status: AC
  Administered 2018-04-09: 2 g via INTRAVENOUS
  Filled 2018-04-09: qty 100

## 2018-04-09 MED ORDER — MEPERIDINE HCL 25 MG/ML IJ SOLN
6.2500 mg | INTRAMUSCULAR | Status: DC | PRN
  Filled 2018-04-09: qty 1

## 2018-04-09 MED ORDER — BELLADONNA ALKALOIDS-OPIUM 16.2-60 MG RE SUPP
RECTAL | Status: DC | PRN
  Administered 2018-04-09: 1 via RECTAL

## 2018-04-09 SURGICAL SUPPLY — 24 items
BAG DRAIN URO-CYSTO SKYTR STRL (DRAIN) ×3 IMPLANT
BASKET LASER NITINOL 1.9FR (BASKET) ×3 IMPLANT
BENZOIN TINCTURE PRP APPL 2/3 (GAUZE/BANDAGES/DRESSINGS) ×3 IMPLANT
CATH URET 5FR 28IN OPEN ENDED (CATHETERS) ×3 IMPLANT
CATH URET DUAL LUMEN 6-10FR 50 (CATHETERS) ×3 IMPLANT
CLOTH BEACON ORANGE TIMEOUT ST (SAFETY) ×3 IMPLANT
DRSG TEGADERM 4X4.75 (GAUZE/BANDAGES/DRESSINGS) ×3 IMPLANT
EXTRACTOR STONE 1.7FRX115CM (UROLOGICAL SUPPLIES) IMPLANT
FIBER LASER TRAC TIP (UROLOGICAL SUPPLIES) ×3 IMPLANT
GLOVE BIO SURGEON STRL SZ7.5 (GLOVE) ×3 IMPLANT
GOWN STRL REUS W/TWL XL LVL3 (GOWN DISPOSABLE) ×3 IMPLANT
GUIDEWIRE ANG ZIPWIRE 038X150 (WIRE) IMPLANT
GUIDEWIRE STR DUAL SENSOR (WIRE) ×6 IMPLANT
INFUSOR MANOMETER BAG 3000ML (MISCELLANEOUS) IMPLANT
IV NS IRRIG 3000ML ARTHROMATIC (IV SOLUTION) ×6 IMPLANT
KIT TURNOVER CYSTO (KITS) ×3 IMPLANT
MANIFOLD NEPTUNE II (INSTRUMENTS) IMPLANT
NS IRRIG 500ML POUR BTL (IV SOLUTION) ×3 IMPLANT
PACK CYSTO (CUSTOM PROCEDURE TRAY) ×3 IMPLANT
SHEATH URETERAL 12FRX35CM (MISCELLANEOUS) ×3 IMPLANT
STENT URET 6FRX26 CONTOUR (STENTS) ×3 IMPLANT
TUBE CONNECTING 12'X1/4 (SUCTIONS)
TUBE CONNECTING 12X1/4 (SUCTIONS) IMPLANT
TUBING UROLOGY SET (TUBING) ×3 IMPLANT

## 2018-04-09 NOTE — Discharge Instructions (Signed)
DISCHARGE INSTRUCTIONS FOR KIDNEY STONE/URETERAL STENT   MEDICATIONS:  1.  Resume all your other meds from home - except do not take any extra narcotic pain meds that you may have at home.  2. Pyridium is to help with the burning/stinging when you urinate. 3. Tramadol is for moderate/severe pain, otherwise taking upto 1000 mg every 6 hours of plainTylenol will help treat your pain. 4. No motrin, ibuprofen, advil, aleve until 625 pm     ACTIVITY:  1. No strenuous activity x 1week  2. No driving while on narcotic pain medications  3. Drink plenty of water  4. Continue to walk at home - you can still get blood clots when you are at home, so keep active, but don't over do it.  5. May return to work/school tomorrow or when you feel ready   BATHING:  1. You can shower and we recommend daily showers  2. You have a string coming from your urethra: The stent string is attached to your ureteral stent. Do not pull on this.   SIGNS/SYMPTOMS TO CALL:  Please call us if you have a fever greater than 101.5, uncontrolled nausea/vomiting, uncontrolled pain, dizziness, unable to urinate, bloody urine, chest pain, shortness of breath, leg swelling, leg pain, redness around wound, drainage from wound, or any other concerns or questions.   You can reach [place] at [phone].   FOLLOW-UP:  1. You have an appointment for stent removal in 1 week.    Post Anesthesia Home Care Instructions  Activity: Get plenty of rest for the remainder of the day. A responsible adult should stay with you for 24 hours following the procedure.  For the next 24 hours, DO NOT: -Drive a car -Paediatric nurse -Drink alcoholic beverages -Take any medication unless instructed by your physician -Make any legal decisions or sign important papers.  Meals: Start with liquid foods such as gelatin or soup. Progress to regular foods as tolerated. Avoid greasy, spicy, heavy foods. If nausea and/or vomiting occur, drink only clear  liquids until the nausea and/or vomiting subsides. Call your physician if vomiting continues.  Special Instructions/Symptoms: Your throat may feel dry or sore from the anesthesia or the breathing tube placed in your throat during surgery. If this causes discomfort, gargle with warm salt water. The discomfort should disappear within 24 hours.  If you had a scopolamine patch placed behind your ear for the management of post- operative nausea and/or vomiting:  1. The medication in the patch is effective for 72 hours, after which it should be removed.  Wrap patch in a tissue and discard in the trash. Wash hands thoroughly with soap and water. 2. You may remove the patch earlier than 72 hours if you experience unpleasant side effects which may include dry mouth, dizziness or visual disturbances. 3. Avoid touching the patch. Wash your hands with soap and water after contact with the patch.

## 2018-04-09 NOTE — Anesthesia Preprocedure Evaluation (Addendum)
Anesthesia Evaluation  Patient identified by MRN, date of birth, ID band Patient awake    Reviewed: Allergy & Precautions, NPO status , Patient's Chart, lab work & pertinent test results  Airway Mallampati: II  TM Distance: >3 FB Neck ROM: Full    Dental  (+) Edentulous Upper, Edentulous Lower   Pulmonary neg pulmonary ROS, Current Smoker,    Pulmonary exam normal breath sounds clear to auscultation       Cardiovascular negative cardio ROS Normal cardiovascular exam Rhythm:Regular Rate:Normal     Neuro/Psych  Headaches, Seizures -,  PSYCHIATRIC DISORDERS Anxiety negative psych ROS   GI/Hepatic Neg liver ROS, GERD  ,  Endo/Other  diabetes, Type 2  Renal/GU negative Renal ROS     Musculoskeletal negative musculoskeletal ROS (+)   Abdominal   Peds  Hematology  (+) anemia ,   Anesthesia Other Findings   Reproductive/Obstetrics                            Anesthesia Physical Anesthesia Plan  ASA: III  Anesthesia Plan: General   Post-op Pain Management:    Induction: Intravenous  PONV Risk Score and Plan: 2 and Ondansetron and Dexamethasone  Airway Management Planned: LMA  Additional Equipment:   Intra-op Plan:   Post-operative Plan: Extubation in OR  Informed Consent: I have reviewed the patients History and Physical, chart, labs and discussed the procedure including the risks, benefits and alternatives for the proposed anesthesia with the patient or authorized representative who has indicated his/her understanding and acceptance.   Dental advisory given  Plan Discussed with: CRNA  Anesthesia Plan Comments:        Anesthesia Quick Evaluation

## 2018-04-09 NOTE — Anesthesia Procedure Notes (Signed)
Procedure Name: LMA Insertion Date/Time: 04/09/2018 11:14 AM Performed by: Justice Rocher, CRNA Pre-anesthesia Checklist: Patient identified, Emergency Drugs available, Suction available and Patient being monitored Patient Re-evaluated:Patient Re-evaluated prior to induction Oxygen Delivery Method: Circle system utilized Preoxygenation: Pre-oxygenation with 100% oxygen Induction Type: IV induction Ventilation: Mask ventilation without difficulty LMA: LMA inserted LMA Size: 5.0 Number of attempts: 1 Airway Equipment and Method: Bite block Placement Confirmation: positive ETCO2 and breath sounds checked- equal and bilateral Tube secured with: Tape Dental Injury: Teeth and Oropharynx as per pre-operative assessment

## 2018-04-09 NOTE — Interval H&P Note (Signed)
History and Physical Interval Note:  04/09/2018 10:49 AM  Randy Richards  has presented today for surgery, with the diagnosis of RIGHT URETEROPELVIC JUNCTION STONE  The various methods of treatment have been discussed with the patient and family. After consideration of risks, benefits and other options for treatment, the patient has consented to  Procedure(s): CYSTOSCOPY WITH RIGHT RETROGRADE PYELOGRAM, URETEROSCOPY HOLMIUM LASER AND STENT PLACEMENT (Right) as a surgical intervention .  The patient's history has been reviewed, patient examined, no change in status, stable for surgery.  I have reviewed the patient's chart and labs.  Questions were answered to the patient's satisfaction.     Louis Meckel W

## 2018-04-09 NOTE — H&P (Signed)
Randy Richards is a 47 year-old male patient who is here for further eval and management of kidney stones.  He was diagnosed with a kidney stone on 03/23/2018. The patient presented to [facility] with symptoms of a kidney stone.   The pain is on the right side.   Abdomen/Pelvic CT: right UPJ 58mmx 54mm. The patient underwent CT scan prior to today's appointment.   The patient relates initially having voiding symptoms. He is not currently having flank pain, back pain, groin pain, nausea, vomiting, fever or chills. He has not caught a stone in his urine strainer since his symptoms began.   He has never had surgical treatment for calculi in the past. This is his first kidney stone.   The patient's pain has been present for about a year. However over the past week it seems intensified. He denies any fevers or chills. His pain is predominantly in the right flank region. It seems to be exacerbated with movement.     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 Has smoked since 03/08/1988. Smokes 1 pack per day.   Tobacco Use Assessment Completed: Used Tobacco  in last 30 days? Has never drank.  Drinks 4+ caffeinated drinks per day.    REVIEW OF SYSTEMS:    GU Review Male:   Patient reports frequent urination and get up at night to urinate. Patient denies hard to postpone urination, burning/ pain with urination, leakage of urine, stream starts and stops, trouble starting your stream, have to strain to urinate , erection problems, and penile pain.  Gastrointestinal (Upper):   Patient denies nausea, vomiting, and indigestion/ heartburn.  Gastrointestinal (Lower):   Patient reports constipation. Patient denies diarrhea.  Constitutional:   Patient reports weight loss, night sweats, and fatigue. Patient denies fever.  Skin:   Patient denies skin rash/ lesion and itching.  Eyes:   Patient reports blurred vision. Patient denies double vision.  Ears/ Nose/ Throat:   Patient denies sore throat and sinus problems.  Hematologic/Lymphatic:   Patient denies swollen glands and easy bruising.  Cardiovascular:   Patient denies leg swelling and chest pains.  Respiratory:   Patient denies cough and shortness of breath.  Endocrine:   Patient reports excessive thirst.   Musculoskeletal:   Patient reports back pain and joint pain.   Neurological:   Patient reports headaches. Patient denies dizziness.  Psychologic:   Patient reports depression and anxiety.    VITAL SIGNS:      03/24/2018 08:57 AM  Weight 160 lb / 72.57 kg  Height 69 in / 175.26 cm  BP  134/80 mmHg  Pulse 56 /min  Temperature 97.7 F / 36.5 C  BMI 23.6 kg/m   GU PHYSICAL EXAMINATION:      Notes: The patient has a right CVA tenderness consistent with kidney pain. He has no neuromuscular pain.   MULTI-SYSTEM PHYSICAL EXAMINATION:    Constitutional: Well-nourished. No physical deformities. Normally developed. Good grooming.  Neck: Neck symmetrical, not swollen. Normal tracheal position.  Respiratory: No labored breathing, no use of accessory muscles.   Cardiovascular: Normal temperature, normal  extremity pulses, no swelling, no varicosities.  Lymphatic: No enlargement of neck, axillae, groin.  Skin: No paleness, no jaundice, no cyanosis. No lesion, no ulcer, no rash.  Neurologic / Psychiatric: Oriented to time, oriented to place, oriented to person. No depression, no anxiety, no agitation.  Gastrointestinal: No mass, no tenderness, no rigidity, non obese abdomen.  Eyes: Normal conjunctivae. Normal eyelids.  Ears, Nose, Mouth, and Throat: Left ear no scars, no lesions, no masses. Right ear no scars, no lesions, no masses. Nose no scars, no lesions, no masses. Normal hearing. Normal lips.  Musculoskeletal: Normal gait and station of head and neck.     PAST DATA REVIEWED:  Source Of History:  Patient  Records Review:   Previous Doctor Records, Previous Patient Records, POC Tool  X-Ray Review: C.T. Abdomen/Pelvis: Reviewed Films. Discussed With Patient.     PROCEDURES:          Urinalysis Dipstick Dipstick Cont'd  Color: Yellow Bilirubin: Neg mg/dL  Appearance: Clear Ketones: Neg mg/dL  Specific Gravity: 1.010 Blood: Neg ery/uL  pH: 7.5 Protein: Neg mg/dL  Glucose: Neg mg/dL Urobilinogen: 0.2 mg/dL    Nitrites: Neg    Leukocyte Esterase: Neg leu/uL         Ketoralac 60mg  - [id number], [id number] Qty: 60 Adm. By: Cristobal Goldmann  Unit: mg Lot No [id number]  Route: IM Exp. Date 08/09/2019  Freq: None Mfgr.:   Site: Left Buttock   ASSESSMENT:      ICD-10 Details  1 GU:   Renal calculus - N20.0    PLAN:            Medications New Meds: Ultram 50 mg tablet 1-2 tablet PO Q 6 H   #45  0 Refill(s)  Zofran 4 mg tablet 1 tablet PO Q 4 H   #20  1 Refill(s)            Document Letter(s):  Created for Patient: Clinical Summary         Notes:   The patient has a large right UPJ stone. He has pain consistent with renal colic. He is unlikely to pass the stone. I offered to place a stent today to help ease the patient's discomfort. Over the patient and his care provider have elected to wait  and try to get this scheduled so that we can also take care of the patient's stone. Given the size of the stone it may take a staged procedure. I described surgery for the patient at care provider. Patient understands that following the procedure he will have a stent. Stent will then need to be removed. He also understands that there is a possibility that this is going to require 2 separate surgeries. We will plan for cystoscopy, right retrograde program, right ureteroscopy/laser lithotripsy and stent placement. We'll try to get this scheduled within the next couple weeks. I given the patient additional medication to help ease his pain over the coming weeks while he get surgery scheduled

## 2018-04-09 NOTE — Op Note (Signed)
Preoperative diagnosis: right renal calculus  Postoperative diagnosis: right renal calculus  Procedure:  1. Cystoscopy 2. right ureteroscopy and stone removal 3. Ureteroscopic laser lithotripsy 4. right 77F x 26cm ureteral stent placement  5. right retrograde pyelography with interpretation  Surgeon: Ardis Hughs, MD  Anesthesia: General  Complications: None  Intraoperative findings:  #1.  The left retrograde pyelogram demonstrated normal caliber ureter with no filling defects or abnormalities.  There was a large filling defect in the renal pelvis consistent with the patient's kidney stone. #2: A 26 cm x 6 French double-J ureteral stent was placed in the left ureter   EBL: Minimal  Specimens: 1. right ureteral calculus  Disposition of specimens: [facility] for stone analysis  Indication: Randy Richards is a 47 y.o.   patient with a  right ureteral stone and associated right symptoms. After reviewing the management options for treatment, the patient elected to proceed with the above surgical procedure(s). We have discussed the potential benefits and risks of the procedure, side effects of the proposed treatment, the likelihood of the patient achieving the goals of the procedure, and any potential problems that might occur during the procedure or recuperation. Informed consent has been obtained.   Description of procedure:  The patient was taken to the operating room and general anesthesia was induced.  The patient was placed in the dorsal lithotomy position, prepped and draped in the usual sterile fashion, and preoperative antibiotics were administered. A preoperative time-out was performed.   Cystourethroscopy was performed.  The patient's urethra was examined and was normal and demonstrated bilobar prostatic hypertrophy.  The bladder was then systematically examined in its entirety. There was no evidence for any bladder tumors, stones, or other  mucosal pathology.    Attention then turned to the left ureteral orifice and a ureteral catheter was used to intubate the ureteral orifice.  Omnipaque contrast was injected through the ureteral catheter and a retrograde pyelogram was performed with findings as dictated above.  A 0.38 sensor guidewire was then advanced up the right ureter into the renal pelvis under fluoroscopic guidance.  I then advanced a dual-lumen catheter over the safety wire and advanced a second sensor wire up into the right renal pelvis.  I then advanced a 12/14 [place] x28 cm ureteral access sheath.  This went up without any significant trauma or unusual force.  The inner portion was then removed.  I then advanced the flexible ureteroscope and noted the stone to be in the lower pole.  Using a basket I was able to grasp the stone and put it into the upper pole.  I then used a 200 m fiber and with a combination of dusting and fragmentation the stone was taken care of.  All the large stone fragments I was able to remove with a 0 tip basket.  The remaining fragments were small enough to pass.  I then subsequently removed the ureteroscope without any other abnormalities noted along the ureter.  I injected contrast through the inner portion of the access sheath prior to removing it noting no ureteral extravasation or ureteral abnormalities.    The wire was then backloaded through the cystoscope and a ureteral stent was advance over the wire using Seldinger technique.  The stent was positioned appropriately under fluoroscopic and cystoscopic guidance.  The wire was then removed with an adequate stent curl noted in the renal pelvis as well as in the bladder.  The bladder was then emptied and the procedure ended.  The patient appeared to tolerate the procedure well and without complications.  The patient was able to be awakened and transferred to the recovery unit in satisfactory condition.   Disposition: The tether of the stent was left on  and secured to the ventral aspect of the patient's penis.  The patient will be scheduled for stent removal in 5-7 days in our clinic.

## 2018-04-09 NOTE — Transfer of Care (Signed)
Immediate Anesthesia Transfer of Care Note  Patient: Randy Richards  Procedure(s) Performed: Procedure(s) (LRB): CYSTOSCOPY WITH RIGHT RETROGRADE PYELOGRAM, URETEROSCOPY HOLMIUM LASER AND STENT PLACEMENT (Right)  Patient Location: PACU  Anesthesia Type: General  Level of Consciousness: awake, sedated, patient cooperative and responds to stimulation  Airway & Oxygen Therapy: Patient Spontanous Breathing and Patient connected to [place] O2  Post-op Assessment: Report given to PACU RN, Post -op Vital signs reviewed and stable and Patient moving all extremities  Post vital signs: Reviewed and stable  Complications: No apparent anesthesia complications

## 2018-04-10 ENCOUNTER — Encounter (HOSPITAL_BASED_OUTPATIENT_CLINIC_OR_DEPARTMENT_OTHER): Payer: Self-pay | Admitting: Urology

## 2018-04-10 NOTE — Anesthesia Postprocedure Evaluation (Signed)
Anesthesia Post Note  Patient: Randy Richards  Procedure(s) Performed: CYSTOSCOPY WITH RIGHT RETROGRADE PYELOGRAM, URETEROSCOPY HOLMIUM LASER AND STENT PLACEMENT (Right Bladder)     Patient location during evaluation: PACU Anesthesia Type: General Level of consciousness: awake and alert, awake and oriented Pain management: pain level controlled Vital Signs Assessment: post-procedure vital signs reviewed and stable Respiratory status: spontaneous breathing, nonlabored ventilation and respiratory function stable Cardiovascular status: blood pressure returned to baseline and stable Postop Assessment: no apparent nausea or vomiting Anesthetic complications: no    Last Vitals:  Vitals:   04/09/18 1330 04/09/18 1430  BP:  125/80  Pulse:  (!) 59  Resp: 11 17  Temp:  36.6 C  SpO2:  100%    Last Pain:  Vitals:   04/09/18 1430  TempSrc:   PainSc: 0-No pain                 Catalina Gravel

## 2018-04-13 DIAGNOSIS — N200 Calculus of kidney: Secondary | ICD-10-CM | POA: Diagnosis not present

## 2018-04-17 ENCOUNTER — Encounter: Payer: Self-pay | Admitting: Emergency Medicine

## 2018-04-17 DIAGNOSIS — G4700 Insomnia, unspecified: Secondary | ICD-10-CM | POA: Insufficient documentation

## 2018-04-17 DIAGNOSIS — F411 Generalized anxiety disorder: Secondary | ICD-10-CM

## 2018-04-17 DIAGNOSIS — F422 Mixed obsessional thoughts and acts: Secondary | ICD-10-CM | POA: Insufficient documentation

## 2018-04-17 DIAGNOSIS — F4310 Post-traumatic stress disorder, unspecified: Secondary | ICD-10-CM | POA: Insufficient documentation

## 2018-04-19 ENCOUNTER — Emergency Department (HOSPITAL_COMMUNITY): Payer: PPO

## 2018-04-19 ENCOUNTER — Emergency Department (HOSPITAL_COMMUNITY)
Admission: EM | Admit: 2018-04-19 | Discharge: 2018-04-19 | Disposition: A | Discharge status: Home / Self Care | Payer: PPO | Attending: Emergency Medicine | Admitting: Emergency Medicine

## 2018-04-19 ENCOUNTER — Encounter (HOSPITAL_COMMUNITY): Payer: Self-pay | Admitting: Emergency Medicine

## 2018-04-19 DIAGNOSIS — E119 Type 2 diabetes mellitus without complications: Secondary | ICD-10-CM | POA: Diagnosis not present

## 2018-04-19 DIAGNOSIS — Z79899 Other long term (current) drug therapy: Secondary | ICD-10-CM | POA: Insufficient documentation

## 2018-04-19 DIAGNOSIS — Z7984 Long term (current) use of oral hypoglycemic drugs: Secondary | ICD-10-CM | POA: Diagnosis not present

## 2018-04-19 DIAGNOSIS — Z7982 Long term (current) use of aspirin: Secondary | ICD-10-CM | POA: Insufficient documentation

## 2018-04-19 DIAGNOSIS — K5900 Constipation, unspecified: Secondary | ICD-10-CM | POA: Insufficient documentation

## 2018-04-19 DIAGNOSIS — Z85828 Personal history of other malignant neoplasm of skin: Secondary | ICD-10-CM | POA: Insufficient documentation

## 2018-04-19 DIAGNOSIS — F17210 Nicotine dependence, cigarettes, uncomplicated: Secondary | ICD-10-CM | POA: Diagnosis not present

## 2018-04-19 DIAGNOSIS — R300 Dysuria: Secondary | ICD-10-CM | POA: Diagnosis not present

## 2018-04-19 DIAGNOSIS — Z87820 Personal history of traumatic brain injury: Secondary | ICD-10-CM | POA: Diagnosis not present

## 2018-04-19 DIAGNOSIS — N200 Calculus of kidney: Secondary | ICD-10-CM | POA: Diagnosis not present

## 2018-04-19 DIAGNOSIS — R413 Other amnesia: Secondary | ICD-10-CM | POA: Diagnosis not present

## 2018-04-19 LAB — URINALYSIS, ROUTINE W REFLEX MICROSCOPIC
BILIRUBIN URINE: NEGATIVE
GLUCOSE, UA: NEGATIVE mg/dL
KETONES UR: NEGATIVE mg/dL
NITRITE: NEGATIVE
PROTEIN: 100 mg/dL — AB
RBC / HPF: >50 RBC/hpf — ABNORMAL HIGH (ref 0–5)
Specific Gravity, Urine: 1.016 (ref 1.005–1.030)
WBC, UA: >50 WBC/hpf — ABNORMAL HIGH (ref 0–5)
pH: 8 (ref 5.0–8.0)

## 2018-04-19 LAB — BASIC METABOLIC PANEL
Anion gap: 9 (ref 5–15)
BUN: 16 mg/dL (ref 6–20)
CALCIUM: 10 mg/dL (ref 8.9–10.3)
CO2: 30 mmol/L (ref 22–32)
Chloride: 108 mmol/L (ref 98–111)
Creatinine, Ser: 0.87 mg/dL (ref 0.61–1.24)
Glucose, Bld: 175 mg/dL — ABNORMAL HIGH (ref 70–99)
Potassium: 4.5 mmol/L (ref 3.5–5.1)
SODIUM: 147 mmol/L — AB (ref 135–145)

## 2018-04-19 LAB — CBC WITH DIFFERENTIAL/PLATELET
Abs Immature Granulocytes: 0.02 10*3/uL (ref 0.00–0.07)
Basophils Absolute: 0.1 10*3/uL (ref 0.0–0.1)
Basophils Relative: 1 %
EOS PCT: 2 %
Eosinophils Absolute: 0.1 10*3/uL (ref 0.0–0.5)
HEMATOCRIT: 36.9 % — AB (ref 39.0–52.0)
HEMOGLOBIN: 12.4 g/dL — AB (ref 13.0–17.0)
Immature Granulocytes: 0 %
LYMPHS ABS: 2.2 10*3/uL (ref 0.7–4.0)
LYMPHS PCT: 36 %
MCH: 33.6 pg (ref 26.0–34.0)
MCHC: 33.6 g/dL (ref 30.0–36.0)
MCV: 100 fL (ref 80.0–100.0)
MONO ABS: 0.4 10*3/uL (ref 0.1–1.0)
MONOS PCT: 7 %
Neutro Abs: 3.3 10*3/uL (ref 1.7–7.7)
Neutrophils Relative %: 54 %
PLATELETS: 394 10*3/uL (ref 150–400)
RBC: 3.69 MIL/uL — AB (ref 4.22–5.81)
RDW: 13.2 % (ref 11.5–15.5)
WBC: 6.2 10*3/uL (ref 4.0–10.5)
nRBC: 0 % (ref 0.0–0.2)

## 2018-04-19 IMAGING — CR DG ABDOMEN 1V
2 series · 2 of 2 positions shown · non-contrast
Comparison: None.

CLINICAL DATA: Left flank pain to mid posterior of back x 2 months

EXAM:
ABDOMEN - 1 VIEW

[t abdomen supine (1 of 2)]
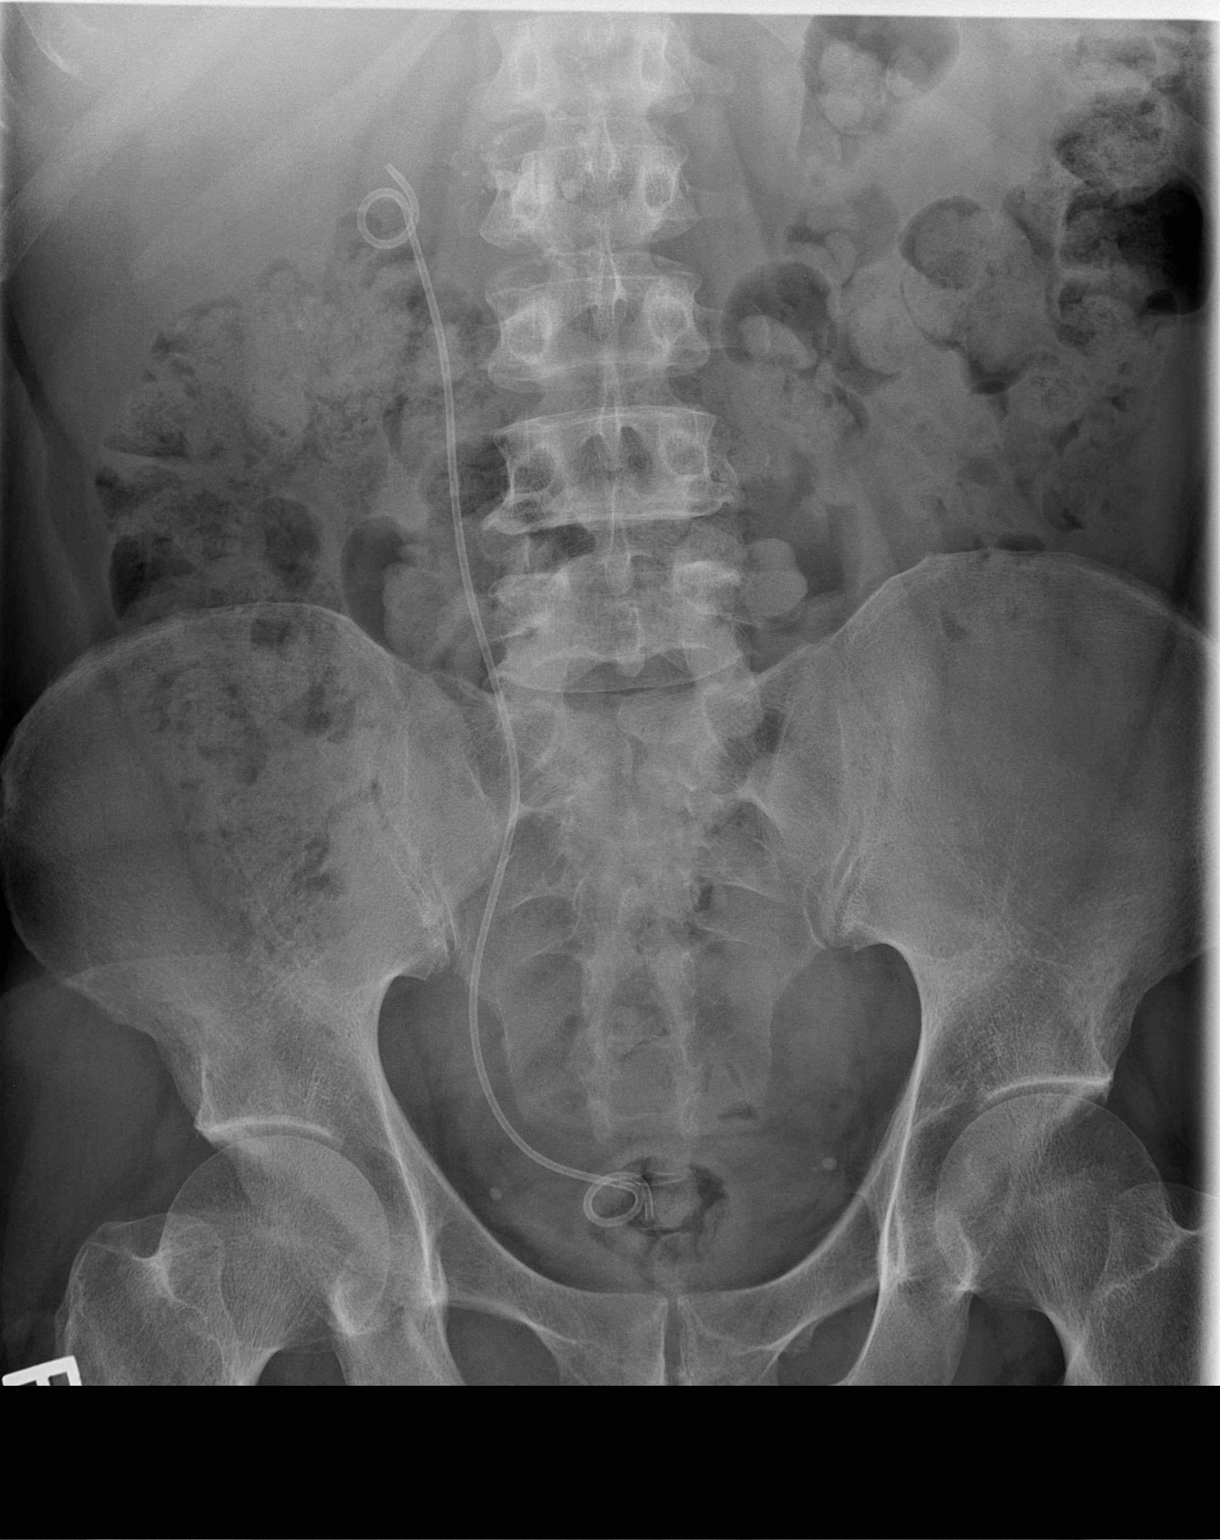

[t abdomen supine (2 of 2)]
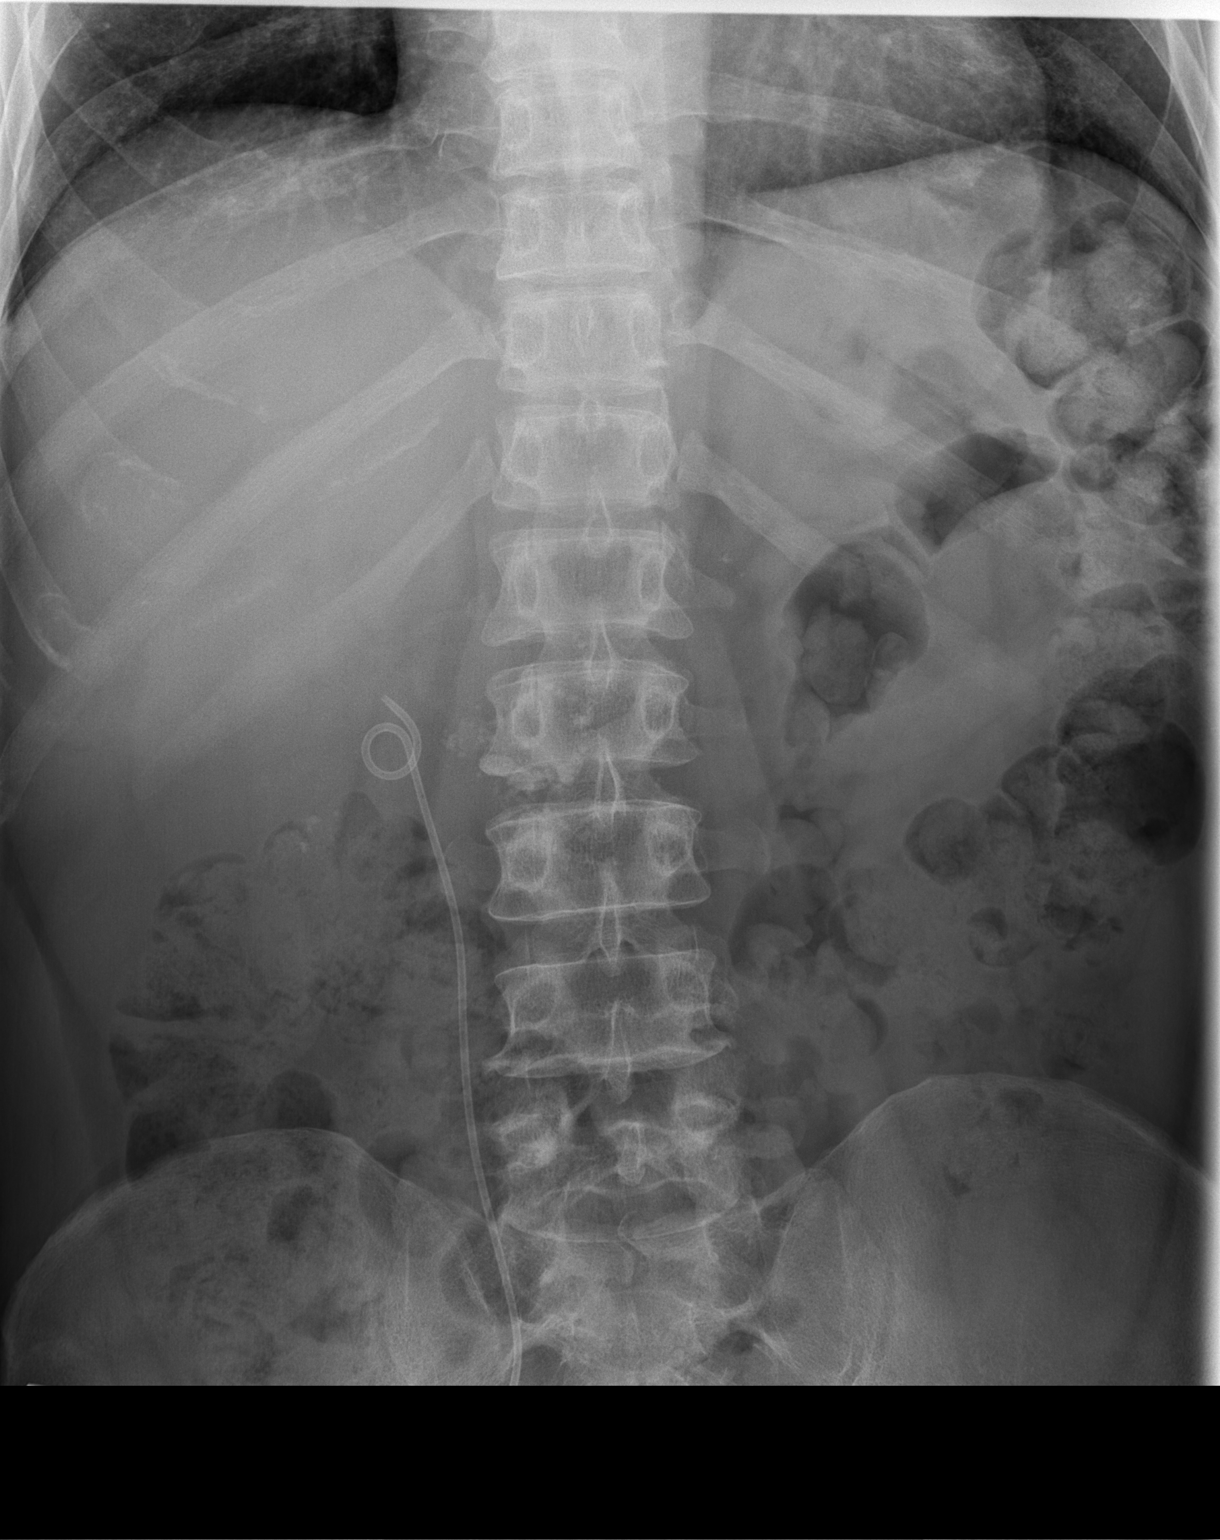

[2 of 2 positions shown; findings below may reference images not displayed]

FINDINGS: Multiple small RIGHT-sided renal stones. RIGHT-sided nephroureteral
stent in place. Small calcifications within the lower pelvis are
most likely vascular phleboliths.

No evidence of LEFT-sided renal or ureteral stone appreciated.

Bowel gas pattern is nonobstructive. No evidence of soft tissue mass
or abnormal fluid collection. No evidence of free intraperitoneal
air.
IMPRESSION: 1. No evidence of LEFT-sided renal or ureteral stone.
2. RIGHT nephrolithiasis.  RIGHT nephroureteral stent in place.
3. Nonobstructive bowel gas pattern. Fairly large amount of stool
throughout the colon (constipation?).

## 2018-04-19 MED ORDER — POLYETHYLENE GLYCOL 3350 17 G PO PACK: 17.0000 g | PACK | Freq: Every day | ORAL | 0 refills | Status: AC

## 2018-04-19 MED ORDER — SODIUM CHLORIDE 0.9 % IV BOLUS
1000.0000 mL | Freq: Once | INTRAVENOUS | Status: AC
  Administered 2018-04-19: 1000 mL via INTRAVENOUS

## 2018-04-19 NOTE — Discharge Instructions (Signed)
Follow-up on Wednesday with Dr. Louis Meckel as planned.  Urine culture has also been sent
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
## 2018-04-19 NOTE — ED Triage Notes (Signed)
Pt had lithotripsy on 10/3 and had stent placed. Pt been having increased fatigue and urinary frequency over past week. Pt having pain with urination and with stent.

## 2018-04-20 LAB — URINE CULTURE

## 2018-04-20 NOTE — ED Notes (Signed)
Patient's pharmacy needed clarification on med

## 2018-04-22 DIAGNOSIS — N200 Calculus of kidney: Secondary | ICD-10-CM | POA: Diagnosis not present

## 2018-04-23 DIAGNOSIS — F419 Anxiety disorder, unspecified: Secondary | ICD-10-CM | POA: Diagnosis not present

## 2018-04-23 DIAGNOSIS — R561 Post traumatic seizures: Secondary | ICD-10-CM | POA: Diagnosis not present

## 2018-04-23 DIAGNOSIS — K5900 Constipation, unspecified: Secondary | ICD-10-CM | POA: Diagnosis not present

## 2018-04-23 DIAGNOSIS — F4310 Post-traumatic stress disorder, unspecified: Secondary | ICD-10-CM | POA: Diagnosis not present

## 2018-04-23 DIAGNOSIS — E785 Hyperlipidemia, unspecified: Secondary | ICD-10-CM | POA: Diagnosis not present

## 2018-04-23 DIAGNOSIS — E119 Type 2 diabetes mellitus without complications: Secondary | ICD-10-CM | POA: Diagnosis not present

## 2018-04-23 DIAGNOSIS — R634 Abnormal weight loss: Secondary | ICD-10-CM | POA: Diagnosis not present

## 2018-04-23 DIAGNOSIS — Z23 Encounter for immunization: Secondary | ICD-10-CM | POA: Diagnosis not present

## 2018-04-27 ENCOUNTER — Encounter: Payer: Self-pay | Admitting: Physician Assistant

## 2018-04-27 ENCOUNTER — Ambulatory Visit: Payer: PPO | Admitting: Physician Assistant

## 2018-04-27 DIAGNOSIS — G4700 Insomnia, unspecified: Secondary | ICD-10-CM | POA: Diagnosis not present

## 2018-04-27 DIAGNOSIS — F331 Major depressive disorder, recurrent, moderate: Secondary | ICD-10-CM

## 2018-04-27 DIAGNOSIS — F4310 Post-traumatic stress disorder, unspecified: Secondary | ICD-10-CM | POA: Diagnosis not present

## 2018-04-27 DIAGNOSIS — F429 Obsessive-compulsive disorder, unspecified: Secondary | ICD-10-CM | POA: Diagnosis not present

## 2018-04-27 MED ORDER — BUSPIRONE HCL 5 MG PO TABS: 10.0000 mg | ORAL_TABLET | Freq: Two times a day (BID) | ORAL | 5 refills | Status: DC

## 2018-04-27 MED ORDER — SERTRALINE HCL 50 MG PO TABS: 50.0000 mg | ORAL_TABLET | ORAL | 5 refills | Status: DC

## 2018-04-27 MED ORDER — DIAZEPAM 5 MG PO TABS: 5.0000 mg | ORAL_TABLET | Freq: Two times a day (BID) | ORAL | 5 refills | Status: DC | PRN

## 2018-04-27 MED ORDER — TRAZODONE HCL 100 MG PO TABS: 200.0000 mg | ORAL_TABLET | Freq: Every day | ORAL | 5 refills | Status: DC
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
## 2018-05-21 DIAGNOSIS — N200 Calculus of kidney: Secondary | ICD-10-CM | POA: Diagnosis not present

## 2018-05-28 DIAGNOSIS — D509 Iron deficiency anemia, unspecified: Secondary | ICD-10-CM | POA: Diagnosis not present

## 2018-05-28 DIAGNOSIS — E119 Type 2 diabetes mellitus without complications: Secondary | ICD-10-CM | POA: Diagnosis not present

## 2018-05-28 DIAGNOSIS — R634 Abnormal weight loss: Secondary | ICD-10-CM | POA: Diagnosis not present

## 2018-05-28 DIAGNOSIS — R561 Post traumatic seizures: Secondary | ICD-10-CM | POA: Diagnosis not present

## 2018-05-28 DIAGNOSIS — E785 Hyperlipidemia, unspecified: Secondary | ICD-10-CM | POA: Diagnosis not present

## 2018-05-28 DIAGNOSIS — D529 Folate deficiency anemia, unspecified: Secondary | ICD-10-CM | POA: Diagnosis not present

## 2018-05-28 DIAGNOSIS — F4310 Post-traumatic stress disorder, unspecified: Secondary | ICD-10-CM | POA: Diagnosis not present

## 2018-05-28 DIAGNOSIS — F419 Anxiety disorder, unspecified: Secondary | ICD-10-CM | POA: Diagnosis not present

## 2018-05-28 DIAGNOSIS — K5900 Constipation, unspecified: Secondary | ICD-10-CM | POA: Diagnosis not present

## 2018-05-28 DIAGNOSIS — Z87820 Personal history of traumatic brain injury: Secondary | ICD-10-CM | POA: Diagnosis not present

## 2018-06-11 DIAGNOSIS — Z79899 Other long term (current) drug therapy: Secondary | ICD-10-CM | POA: Diagnosis not present

## 2018-06-22 ENCOUNTER — Ambulatory Visit: Payer: Self-pay | Admitting: Physician Assistant

## 2018-07-06 ENCOUNTER — Other Ambulatory Visit: Payer: Self-pay | Admitting: Physician Assistant

## 2018-07-27 DIAGNOSIS — M545 Low back pain: Secondary | ICD-10-CM | POA: Diagnosis not present

## 2018-07-27 DIAGNOSIS — K861 Other chronic pancreatitis: Secondary | ICD-10-CM | POA: Diagnosis not present

## 2018-07-27 DIAGNOSIS — M546 Pain in thoracic spine: Secondary | ICD-10-CM | POA: Diagnosis not present

## 2018-07-27 DIAGNOSIS — I7300 Raynaud's syndrome without gangrene: Secondary | ICD-10-CM | POA: Diagnosis not present

## 2018-08-04 DIAGNOSIS — K219 Gastro-esophageal reflux disease without esophagitis: Secondary | ICD-10-CM | POA: Diagnosis not present

## 2018-08-04 DIAGNOSIS — R05 Cough: Secondary | ICD-10-CM | POA: Diagnosis not present

## 2018-08-04 DIAGNOSIS — R7309 Other abnormal glucose: Secondary | ICD-10-CM | POA: Diagnosis not present

## 2018-08-04 DIAGNOSIS — Z79899 Other long term (current) drug therapy: Secondary | ICD-10-CM | POA: Diagnosis not present

## 2018-08-04 DIAGNOSIS — M546 Pain in thoracic spine: Secondary | ICD-10-CM | POA: Diagnosis not present

## 2018-08-04 DIAGNOSIS — I7300 Raynaud's syndrome without gangrene: Secondary | ICD-10-CM | POA: Diagnosis not present

## 2018-08-04 DIAGNOSIS — E785 Hyperlipidemia, unspecified: Secondary | ICD-10-CM | POA: Diagnosis not present

## 2018-08-04 DIAGNOSIS — M545 Low back pain: Secondary | ICD-10-CM | POA: Diagnosis not present

## 2018-08-04 DIAGNOSIS — F419 Anxiety disorder, unspecified: Secondary | ICD-10-CM | POA: Diagnosis not present

## 2018-08-04 DIAGNOSIS — E119 Type 2 diabetes mellitus without complications: Secondary | ICD-10-CM | POA: Diagnosis not present

## 2018-08-04 DIAGNOSIS — K861 Other chronic pancreatitis: Secondary | ICD-10-CM | POA: Diagnosis not present

## 2018-08-07 DIAGNOSIS — E7841 Elevated Lipoprotein(a): Secondary | ICD-10-CM | POA: Diagnosis not present

## 2018-08-07 DIAGNOSIS — K8020 Calculus of gallbladder without cholecystitis without obstruction: Secondary | ICD-10-CM | POA: Diagnosis not present

## 2018-08-12 ENCOUNTER — Encounter: Payer: Self-pay | Admitting: Gastroenterology

## 2018-08-14 DIAGNOSIS — R109 Unspecified abdominal pain: Secondary | ICD-10-CM | POA: Diagnosis not present

## 2018-08-14 DIAGNOSIS — K869 Disease of pancreas, unspecified: Secondary | ICD-10-CM | POA: Diagnosis not present

## 2018-08-19 ENCOUNTER — Encounter: Payer: Self-pay | Admitting: Physician Assistant

## 2018-08-19 ENCOUNTER — Ambulatory Visit: Payer: PPO | Admitting: Physician Assistant

## 2018-08-19 DIAGNOSIS — F429 Obsessive-compulsive disorder, unspecified: Secondary | ICD-10-CM | POA: Diagnosis not present

## 2018-08-19 DIAGNOSIS — F4310 Post-traumatic stress disorder, unspecified: Secondary | ICD-10-CM

## 2018-08-19 DIAGNOSIS — F331 Major depressive disorder, recurrent, moderate: Secondary | ICD-10-CM | POA: Diagnosis not present

## 2018-08-19 DIAGNOSIS — G4700 Insomnia, unspecified: Secondary | ICD-10-CM

## 2018-08-19 MED ORDER — BUSPIRONE HCL 10 MG PO TABS: 10.0000 mg | ORAL_TABLET | Freq: Two times a day (BID) | ORAL | 5 refills | Status: DC

## 2018-08-19 MED ORDER — DIAZEPAM 5 MG PO TABS: 5.0000 mg | ORAL_TABLET | Freq: Three times a day (TID) | ORAL | 5 refills | Status: DC | PRN

## 2018-08-19 MED ORDER — SERTRALINE HCL 100 MG PO TABS: ORAL_TABLET | ORAL | 5 refills | Status: DC

## 2018-08-19 MED ORDER — TRAZODONE HCL 100 MG PO TABS: 100.0000 mg | ORAL_TABLET | Freq: Every evening | ORAL | 5 refills | Status: DC | PRN
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
## 2018-08-24 ENCOUNTER — Encounter (HOSPITAL_COMMUNITY): Payer: Self-pay

## 2018-08-24 ENCOUNTER — Emergency Department (HOSPITAL_COMMUNITY)
Admission: EM | Admit: 2018-08-24 | Discharge: 2018-08-24 | Disposition: A | Discharge status: Home / Self Care | Payer: PPO | Attending: Emergency Medicine | Admitting: Emergency Medicine

## 2018-08-24 ENCOUNTER — Emergency Department (HOSPITAL_COMMUNITY): Payer: PPO

## 2018-08-24 ENCOUNTER — Telehealth: Payer: Self-pay

## 2018-08-24 DIAGNOSIS — K8020 Calculus of gallbladder without cholecystitis without obstruction: Secondary | ICD-10-CM | POA: Diagnosis not present

## 2018-08-24 DIAGNOSIS — R109 Unspecified abdominal pain: Secondary | ICD-10-CM

## 2018-08-24 DIAGNOSIS — Z79899 Other long term (current) drug therapy: Secondary | ICD-10-CM | POA: Insufficient documentation

## 2018-08-24 DIAGNOSIS — F17210 Nicotine dependence, cigarettes, uncomplicated: Secondary | ICD-10-CM | POA: Diagnosis not present

## 2018-08-24 DIAGNOSIS — R1110 Vomiting, unspecified: Secondary | ICD-10-CM | POA: Diagnosis present

## 2018-08-24 DIAGNOSIS — Z7984 Long term (current) use of oral hypoglycemic drugs: Secondary | ICD-10-CM | POA: Diagnosis not present

## 2018-08-24 DIAGNOSIS — Z7982 Long term (current) use of aspirin: Secondary | ICD-10-CM | POA: Diagnosis not present

## 2018-08-24 DIAGNOSIS — E119 Type 2 diabetes mellitus without complications: Secondary | ICD-10-CM | POA: Diagnosis not present

## 2018-08-24 DIAGNOSIS — K8050 Calculus of bile duct without cholangitis or cholecystitis without obstruction: Secondary | ICD-10-CM

## 2018-08-24 DIAGNOSIS — R112 Nausea with vomiting, unspecified: Secondary | ICD-10-CM | POA: Diagnosis not present

## 2018-08-24 LAB — LIPASE, BLOOD: Lipase: 66 U/L — ABNORMAL HIGH (ref 11–51)

## 2018-08-24 LAB — COMPREHENSIVE METABOLIC PANEL
ALBUMIN: 4.8 g/dL (ref 3.5–5.0)
ALT: 16 U/L (ref 0–44)
ANION GAP: 8 (ref 5–15)
AST: 15 U/L (ref 15–41)
Alkaline Phosphatase: 60 U/L (ref 38–126)
BUN: 10 mg/dL (ref 6–20)
CO2: 26 mmol/L (ref 22–32)
Calcium: 9.1 mg/dL (ref 8.9–10.3)
Chloride: 96 mmol/L — ABNORMAL LOW (ref 98–111)
Creatinine, Ser: 0.61 mg/dL (ref 0.61–1.24)
GFR calc Af Amer: >60 mL/min (ref >60)
GFR calc non Af Amer: >60 mL/min (ref >60)
GLUCOSE: 86 mg/dL (ref 70–99)
POTASSIUM: 4.9 mmol/L (ref 3.5–5.1)
SODIUM: 130 mmol/L — AB (ref 135–145)
TOTAL PROTEIN: 7.2 g/dL (ref 6.5–8.1)
Total Bilirubin: 0.6 mg/dL (ref 0.3–1.2)

## 2018-08-24 LAB — CBC
HEMATOCRIT: 41.2 % (ref 39.0–52.0)
HEMOGLOBIN: 14.4 g/dL (ref 13.0–17.0)
MCH: 33.9 pg (ref 26.0–34.0)
MCHC: 35 g/dL (ref 30.0–36.0)
MCV: 96.9 fL (ref 80.0–100.0)
Platelets: 308 10*3/uL (ref 150–400)
RBC: 4.25 MIL/uL (ref 4.22–5.81)
RDW: 12.2 % (ref 11.5–15.5)
WBC: 8.8 10*3/uL (ref 4.0–10.5)
nRBC: 0 % (ref 0.0–0.2)

## 2018-08-24 LAB — URINALYSIS, ROUTINE W REFLEX MICROSCOPIC
Bilirubin Urine: NEGATIVE
Glucose, UA: NEGATIVE mg/dL
Hgb urine dipstick: NEGATIVE
KETONES UR: NEGATIVE mg/dL
Leukocytes,Ua: NEGATIVE
Nitrite: NEGATIVE
PROTEIN: NEGATIVE mg/dL
Specific Gravity, Urine: 1.01 (ref 1.005–1.030)
pH: 7 (ref 5.0–8.0)

## 2018-08-24 MED ORDER — PROMETHAZINE HCL 25 MG PO TABS: 25.0000 mg | ORAL_TABLET | Freq: Four times a day (QID) | ORAL | 0 refills | Status: AC | PRN

## 2018-08-24 MED ORDER — HYDROCODONE-ACETAMINOPHEN 5-325 MG PO TABS: 1.0000 | ORAL_TABLET | Freq: Four times a day (QID) | ORAL | 0 refills | Status: DC | PRN

## 2018-08-24 MED ORDER — SODIUM CHLORIDE 0.9% FLUSH: 3.0000 mL | Freq: Once | INTRAVENOUS | Status: DC

## 2018-08-24 MED ORDER — ONDANSETRON HCL 4 MG/2ML IJ SOLN
4.0000 mg | Freq: Once | INTRAMUSCULAR | Status: AC
  Administered 2018-08-24: 4 mg via INTRAVENOUS
  Filled 2018-08-24: qty 2

## 2018-08-24 MED ORDER — HYDROCODONE-ACETAMINOPHEN 5-325 MG PO TABS
1.0000 | ORAL_TABLET | Freq: Once | ORAL | Status: AC
  Administered 2018-08-24: 1 via ORAL
  Filled 2018-08-24: qty 1
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
## 2018-08-24 NOTE — Telephone Encounter (Signed)
-----   Message from Addison Lank, PA-C sent at 08/19/2018  5:36 PM EST ----- Regarding: call caregiver Please call [name] [phone] and let her know that I was thinking about Randy Richards's situation with the worsening of the OCD.  I did not consider increasing the Zoloft when they were here at the appointment today but I think that would be a good idea.  Please run this by her she is his primary caregiver.  (He is disabled and needs 24/7 care) if she is in agreement lets increase it to 100 mg, 2 pills daily and I will see them back in 8 weeks as we discussed.  I tried to call her this evening but her mailbox was full.Also ask her how serious the "tumor" in his pancreas may be.  I know a biopsy has not been done yet.  We were unable to discuss this in front of him at the visit.Thanks Olivia Mackie

## 2018-08-24 NOTE — ED Triage Notes (Signed)
Pt presents with c/o abdominal pain and vomiting. Pain started over the weekend, hx of pancreatitis.

## 2018-08-24 NOTE — Discharge Instructions (Addendum)
Follow-up with his GI doctor.  Return here for any worsening in his condition.  I would recommend following a more bland diet until you follow-up with GI.

## 2018-08-24 NOTE — Progress Notes (Signed)
CSW received phone call from RN stating legal guardian has questions. CSW spoke with patient, legal guardian and caregiver supervisor at bedside. Per legal guardian, patient has 24 hour caregivers and that the caregiver supervisor, [name], is heavily involved in patient's care. Legal guardian requesting [name] be allowed to receive information regarding patient and be able to make decisions for patient when he is not available. CSW explained all decisions would still need to go through legal guardian. CSW received verbal and written permission from legal gaurdian, Glenna Fellows, to release information to [name], Geologist, engineering.  Ollen Barges, [place] Work Department  [employer]  [phone]

## 2018-08-25 ENCOUNTER — Telehealth: Payer: Self-pay

## 2018-08-25 NOTE — Telephone Encounter (Signed)
-----   Message from Addison Lank, PA-C sent at 08/19/2018  5:36 PM EST ----- Regarding: call caregiver Please call [name] [phone] and let her know that I was thinking about Randy Richards's situation with the worsening of the OCD.  I did not consider increasing the Zoloft when they were here at the appointment today but I think that would be a good idea.  Please run this by her she is his primary caregiver.  (He is disabled and needs 24/7 care) if she is in agreement lets increase it to 100 mg, 2 pills daily and I will see them back in 8 weeks as we discussed.  I tried to call her this evening but her mailbox was full.Also ask her how serious the "tumor" in his pancreas may be.  I know a biopsy has not been done yet.  We were unable to discuss this in front of him at the visit.Thanks Olivia Mackie

## 2018-08-25 NOTE — Telephone Encounter (Signed)
Unable to reach [name] (guardian),mailbox full. Was able to leave a voicemail on [name]'s (guardian) cell phone to call back.

## 2018-08-27 ENCOUNTER — Ambulatory Visit (INDEPENDENT_AMBULATORY_CARE_PROVIDER_SITE_OTHER): Payer: PPO | Admitting: Gastroenterology

## 2018-08-27 ENCOUNTER — Encounter: Payer: Self-pay | Admitting: Gastroenterology

## 2018-08-27 VITALS — BP 130/90 | HR 58 | Ht 69.0 in | Wt 166.0 lb

## 2018-08-27 DIAGNOSIS — K861 Other chronic pancreatitis: Secondary | ICD-10-CM

## 2018-08-27 NOTE — Patient Instructions (Signed)
If you are age 49 or older, your body mass index should be between 23-30. Your Body mass index is 24.51 kg/m. If this is out of the aforementioned range listed, please consider follow up with your Primary Care Provider.  If you are age 51 or younger, your body mass index should be between 19-25. Your Body mass index is 24.51 kg/m. If this is out of the aformentioned range listed, please consider follow up with your Primary Care Provider.   You have been scheduled for an MRI at [facility] on 09/03/18. Your appointment time is 8am. Please arrive 30 minutes prior to your appointment time for registration purposes. Please make certain not to have anything to eat or drink 6 hours prior to your test. In addition, if you have any metal in your body, have a pacemaker or defibrillator, please be sure to let your ordering physician know. This test typically takes 45 minutes to 1 hour to complete. Should you need to reschedule, please call [phone] to do so.   Please purchase the following medications over the counter and take as directed: Miralax 17 grams twice daily until a good BM and then stool softener twice daily (Colace)   Thank you,  Dr. Jackquline Denmark
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
## 2018-08-31 ENCOUNTER — Other Ambulatory Visit: Payer: Self-pay | Admitting: Physician Assistant

## 2018-08-31 ENCOUNTER — Telehealth: Payer: Self-pay | Admitting: Physician Assistant

## 2018-08-31 ENCOUNTER — Other Ambulatory Visit: Payer: Self-pay

## 2018-08-31 MED ORDER — SERTRALINE HCL 100 MG PO TABS: 200.0000 mg | ORAL_TABLET | Freq: Every day | ORAL | 2 refills | Status: DC

## 2018-08-31 NOTE — Telephone Encounter (Signed)
Randy Richards verbalized understanding of increase in Zoloft, asked to send in rx as well to [name]. Also letting you know [name] is waiting on MRI results but it's probable he will have to have gall bladder surgery due to the gall stones being lodged in the pancreas. He is on a very low dose of vicodin but she only gives him this when he's in severe pain.

## 2018-08-31 NOTE — Telephone Encounter (Signed)
Traci please call caregiver, [name].  Increase Zoloft to 200 mg.  Call back in 2 weeks, if he's tolerating it well, we can bump up more.  But for now, go up to 200mg .
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
## 2018-09-01 ENCOUNTER — Other Ambulatory Visit: Payer: Self-pay

## 2018-09-03 ENCOUNTER — Ambulatory Visit (HOSPITAL_COMMUNITY)
Admission: RE | Admit: 2018-09-03 | Discharge: 2018-09-03 | Disposition: A | Discharge status: Home / Self Care | Payer: PPO | Source: Ambulatory Visit | Attending: Gastroenterology | Admitting: Gastroenterology

## 2018-09-03 ENCOUNTER — Other Ambulatory Visit: Payer: Self-pay | Admitting: Gastroenterology

## 2018-09-03 DIAGNOSIS — K861 Other chronic pancreatitis: Secondary | ICD-10-CM

## 2018-09-03 NOTE — Progress Notes (Signed)
Patient was scheduled for MRI  09/03/18 at 8am, patient was a no call no show  bhj

## 2018-09-24 ENCOUNTER — Ambulatory Visit: Payer: PPO | Admitting: Physician Assistant

## 2018-10-08 DIAGNOSIS — K861 Other chronic pancreatitis: Secondary | ICD-10-CM | POA: Diagnosis not present

## 2018-10-08 DIAGNOSIS — B351 Tinea unguium: Secondary | ICD-10-CM | POA: Diagnosis not present

## 2018-10-08 DIAGNOSIS — Z87820 Personal history of traumatic brain injury: Secondary | ICD-10-CM | POA: Diagnosis not present

## 2018-10-08 DIAGNOSIS — F4310 Post-traumatic stress disorder, unspecified: Secondary | ICD-10-CM | POA: Diagnosis not present

## 2018-10-14 ENCOUNTER — Ambulatory Visit: Payer: PPO | Admitting: Physician Assistant

## 2018-10-20 ENCOUNTER — Other Ambulatory Visit: Payer: Self-pay | Admitting: Physician Assistant

## 2018-10-20 ENCOUNTER — Telehealth: Payer: Self-pay | Admitting: Physician Assistant

## 2018-10-20 MED ORDER — TRAZODONE HCL 100 MG PO TABS: 100.0000 mg | ORAL_TABLET | Freq: Every evening | ORAL | 5 refills | Status: DC | PRN

## 2018-10-20 NOTE — Telephone Encounter (Signed)
Pt caregiver [name] called. Pharm will not refill Trazodone  Rx reads 100 mg 1 tab should be 2 tabs of 100 mg = 200 mg at bedtime. #60   Ph[name]. He also ran out too early due to taking 2 tabs in lieu of 1 tab.

## 2018-10-20 NOTE — Telephone Encounter (Signed)
I sent in corrected Rx.

## 2018-11-23 ENCOUNTER — Ambulatory Visit (HOSPITAL_COMMUNITY): Admission: RE | Admit: 2018-11-23 | Payer: PPO | Source: Ambulatory Visit

## 2018-11-24 ENCOUNTER — Ambulatory Visit (HOSPITAL_COMMUNITY): Payer: PPO

## 2018-12-09 ENCOUNTER — Other Ambulatory Visit: Payer: Self-pay | Admitting: Physician Assistant

## 2019-03-01 ENCOUNTER — Other Ambulatory Visit: Payer: Self-pay | Admitting: Physician Assistant

## 2019-03-01 NOTE — Telephone Encounter (Signed)
Randy Richards, please call his caregiver, [name], and set up an appt me.  I'll go ahead and send in Rx, but needs OV for more.

## 2019-03-01 NOTE — Telephone Encounter (Signed)
Last appt 08/2018 Nothing scheduled

## 2019-03-22 ENCOUNTER — Other Ambulatory Visit: Payer: Self-pay | Admitting: Physician Assistant

## 2019-03-22 NOTE — Telephone Encounter (Signed)
Last visit 08/2018

## 2019-04-09 ENCOUNTER — Other Ambulatory Visit: Payer: Self-pay | Admitting: Physician Assistant

## 2019-04-09 NOTE — Telephone Encounter (Signed)
Last appt 08/2018

## 2019-04-12 NOTE — Telephone Encounter (Signed)
Angie, please call his caregiver to set up an appt.  Let her know [name]) I've sent this in but he's got to set up appt for more.  Thank you!

## 2019-04-13 ENCOUNTER — Other Ambulatory Visit: Payer: Self-pay | Admitting: Physician Assistant

## 2019-04-15 DIAGNOSIS — U071 COVID-19: Secondary | ICD-10-CM | POA: Diagnosis not present

## 2019-04-15 DIAGNOSIS — Z1159 Encounter for screening for other viral diseases: Secondary | ICD-10-CM | POA: Diagnosis not present

## 2019-04-19 DIAGNOSIS — K861 Other chronic pancreatitis: Secondary | ICD-10-CM | POA: Diagnosis not present

## 2019-04-19 DIAGNOSIS — E119 Type 2 diabetes mellitus without complications: Secondary | ICD-10-CM | POA: Diagnosis not present

## 2019-04-19 DIAGNOSIS — E785 Hyperlipidemia, unspecified: Secondary | ICD-10-CM | POA: Diagnosis not present

## 2019-04-19 DIAGNOSIS — Z23 Encounter for immunization: Secondary | ICD-10-CM | POA: Diagnosis not present

## 2019-04-19 DIAGNOSIS — M545 Low back pain: Secondary | ICD-10-CM | POA: Diagnosis not present

## 2019-04-26 DIAGNOSIS — M545 Low back pain: Secondary | ICD-10-CM | POA: Diagnosis not present

## 2019-05-05 DIAGNOSIS — K861 Other chronic pancreatitis: Secondary | ICD-10-CM | POA: Diagnosis not present

## 2019-05-05 DIAGNOSIS — R1010 Upper abdominal pain, unspecified: Secondary | ICD-10-CM | POA: Diagnosis not present

## 2019-05-12 ENCOUNTER — Other Ambulatory Visit: Payer: Self-pay

## 2019-05-12 ENCOUNTER — Other Ambulatory Visit: Payer: PPO

## 2019-05-12 ENCOUNTER — Encounter: Payer: Self-pay | Admitting: Gastroenterology

## 2019-05-12 ENCOUNTER — Ambulatory Visit (INDEPENDENT_AMBULATORY_CARE_PROVIDER_SITE_OTHER): Payer: PPO | Admitting: Gastroenterology

## 2019-05-12 VITALS — BP 118/74 | HR 75 | Ht 69.0 in | Wt 163.0 lb

## 2019-05-12 DIAGNOSIS — K861 Other chronic pancreatitis: Secondary | ICD-10-CM | POA: Diagnosis not present

## 2019-05-12 NOTE — Patient Instructions (Signed)
Your provider has requested that you go to the basement level for lab work at [address] in [place] [address]. Press "B" on the elevator. The lab is located at the first door on the left as you exit the elevator.  Thank you,  Dr. Rajesh Gupta  
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
## 2019-05-13 LAB — CEA: CEA: 1.7 ng/mL

## 2019-05-13 LAB — CANCER ANTIGEN 19-9: CA 19-9: 16 U/mL (ref <34)

## 2019-05-19 ENCOUNTER — Other Ambulatory Visit: Payer: Self-pay | Admitting: Physician Assistant

## 2019-05-19 NOTE — Telephone Encounter (Signed)
Still no apt since 08/2018

## 2019-05-20 ENCOUNTER — Telehealth: Payer: Self-pay

## 2019-05-20 DIAGNOSIS — K861 Other chronic pancreatitis: Secondary | ICD-10-CM

## 2019-05-20 NOTE — Telephone Encounter (Signed)
MRI and CT results on MD desk for review-please advise

## 2019-05-21 NOTE — Addendum Note (Signed)
Addended by: Mohammed Kindle on: 05/21/2019 04:58 PM   Modules accepted: Orders

## 2019-05-21 NOTE — Telephone Encounter (Signed)
MRI abd with MRCP order has been placed in Epic; lab work orders have been placed in [place];  Patient has been scheduled for this MRI at [facility] at [place]. (Park in front of the building) on 05/27/2019 at 4:00 pm; arrival at 3:30 am; NPO x 4hr (at noon);   BMP is needed prior to MRI for creatinine clearance; Information given to caregiver-[name]-verified DPR- Cletus Gash verbalized understanding of information/instructions; Cletus Gash advised to call back to the office at [phone] should questions/concerns arise;

## 2019-05-21 NOTE — Telephone Encounter (Signed)
Sarah please call caregiver, [name].  He MUST make appt or this will be the last Rx I give.  Thanks.

## 2019-05-21 NOTE — Telephone Encounter (Signed)
He has an appt 12/15

## 2019-05-21 NOTE — Telephone Encounter (Signed)
MRI back 04/26/2019-ordered by Dorna Mai show broad-based disc bulge asymmetric to the right at L4-L5 with a slight mass-effect upon right L4 nerve.  MRI of the pancreas was not done.  Plan: -As per previous note, proceed with MRI of the pancreas with MRCP. -Check CA 19-9, CEA level  Thx  RG

## 2019-05-25 ENCOUNTER — Other Ambulatory Visit (INDEPENDENT_AMBULATORY_CARE_PROVIDER_SITE_OTHER): Payer: PPO

## 2019-05-25 DIAGNOSIS — K861 Other chronic pancreatitis: Secondary | ICD-10-CM

## 2019-05-25 LAB — BASIC METABOLIC PANEL
BUN: 9 mg/dL (ref 6–23)
CO2: 30 mEq/L (ref 19–32)
Calcium: 9.8 mg/dL (ref 8.4–10.5)
Chloride: 99 mEq/L (ref 96–112)
Creatinine, Ser: 0.75 mg/dL (ref 0.40–1.50)
GFR: 110.61 mL/min (ref >60.00)
Glucose, Bld: 133 mg/dL — ABNORMAL HIGH (ref 70–99)
Potassium: 3.7 mEq/L (ref 3.5–5.1)
Sodium: 138 mEq/L (ref 135–145)

## 2019-05-27 ENCOUNTER — Ambulatory Visit (HOSPITAL_COMMUNITY)
Admission: RE | Admit: 2019-05-27 | Discharge: 2019-05-27 | Disposition: A | Discharge status: Home / Self Care | Payer: PPO | Source: Ambulatory Visit | Attending: Gastroenterology | Admitting: Gastroenterology

## 2019-05-27 ENCOUNTER — Other Ambulatory Visit: Payer: Self-pay

## 2019-05-27 DIAGNOSIS — K861 Other chronic pancreatitis: Secondary | ICD-10-CM | POA: Diagnosis not present

## 2019-05-27 DIAGNOSIS — K8020 Calculus of gallbladder without cholecystitis without obstruction: Secondary | ICD-10-CM | POA: Diagnosis not present

## 2019-05-27 IMAGING — MR MR ABDOMEN WO/W CM MRCP
13 of 22 series · 19 of 48 positions shown · IV contrast (gadavist)
Comparison: Comparison studies from [facility] in [DATE].

CLINICAL DATA: History of chronic pancreatitis.

EXAM:
MRI ABDOMEN WITHOUT AND WITH CONTRAST (INCLUDING MRCP)
TECHNIQUE: Multiplanar multisequence MR imaging of the abdomen was performed
both before and after the administration of intravenous contrast.
Heavily T2-weighted images of the biliary and pancreatic ducts were
obtained, and three-dimensional MRCP images were rendered by post
processing.
CONTRAST:  7mL GADAVIST GADOBUTROL 1 MMOL/ML IV SOLN

[Series 1: 3 plane non · axial · 8.0mm · 0.78mm/px · 1 of 13 slices shown]
[im 1/13]
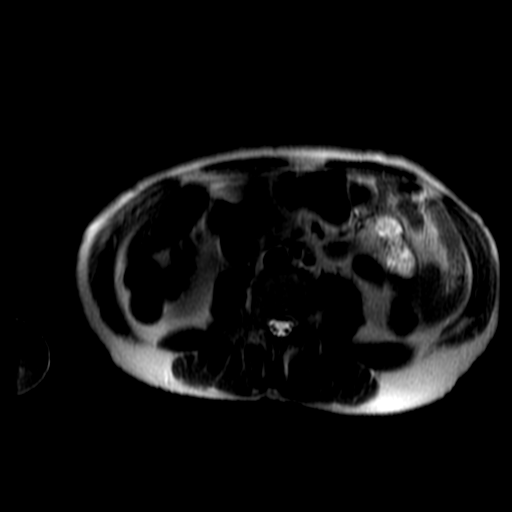

[Series 3: T2 fat-sat · axial · 5.0mm · 0.86mm/px · 1 of 56 slices shown]
[im 1/56]
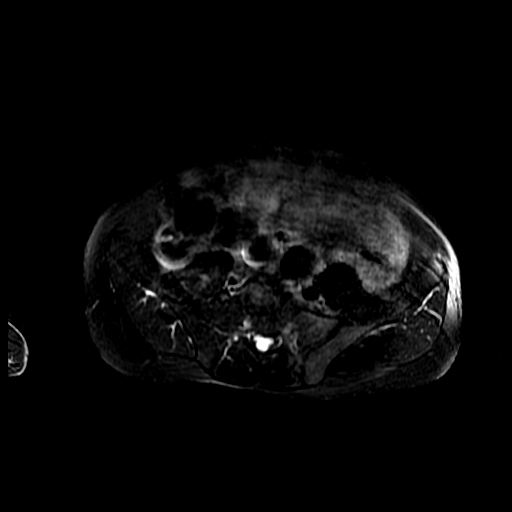

[Series 5: MRCP · coronal · 2.0mm · 0.70mm/px · 1 of 55 slices shown (1 of 2)]
[im 1/55]
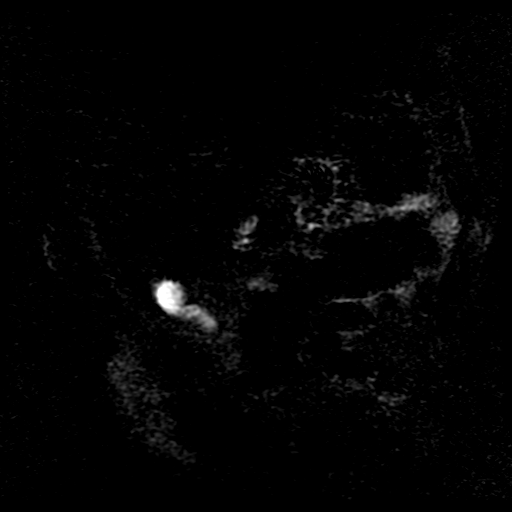

[Series 6: DWI b500 · axial · 6.0mm · 1.76mm/px · 1 of 77 slices shown]
[im 1/77]
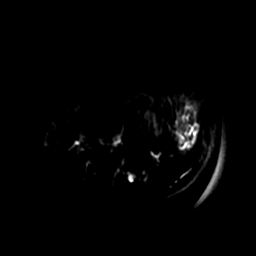

[Series 7: T2 · coronal · 5.0mm · 0.76mm/px · 1 of 47 slices shown (1 of 2)]
[im 1/47]
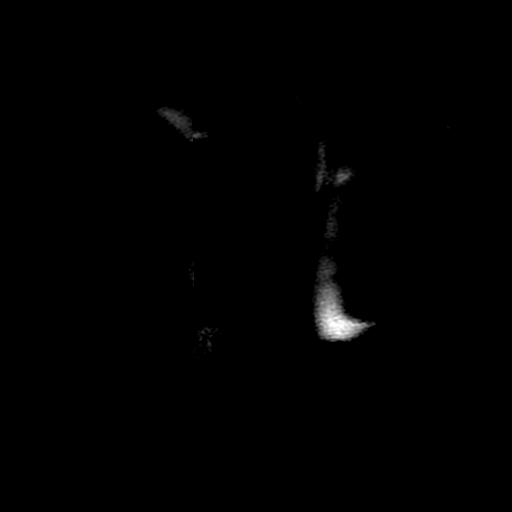

[Series 8: T2 · axial · 5.0mm · 0.86mm/px · 1 of 58 slices shown (2 of 2)]
[im 1/58]
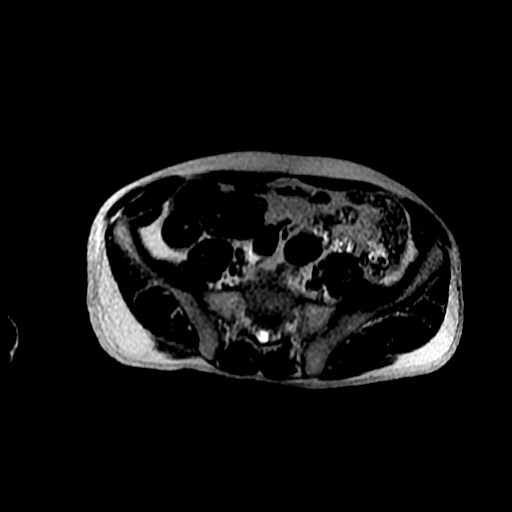

[Series 9: ax dualecho bh · axial · 5.0mm · 0.86mm/px · z∈[-94,+181]mm · 2 of 112 slices shown]
[im 1/112]
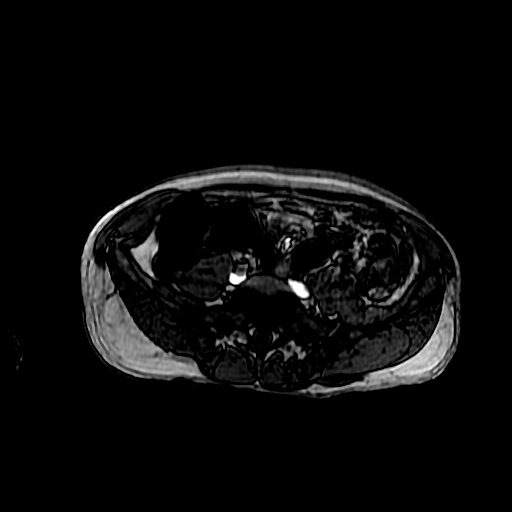
[im 112/112]
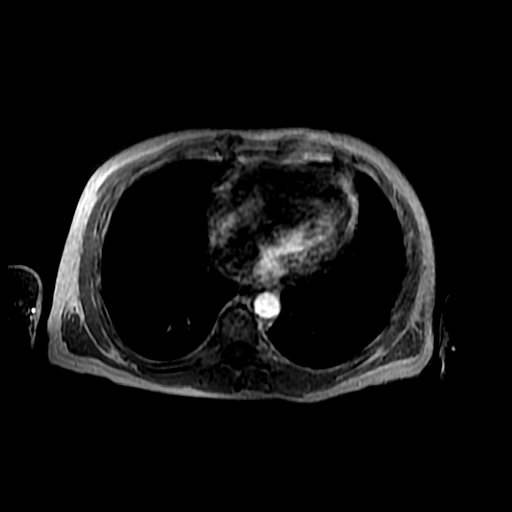

[Series 10: MRCP · coronal · 40.0mm · 0.70mm/px · 1 of 7 slices shown (2 of 2)]
[im 1/7]
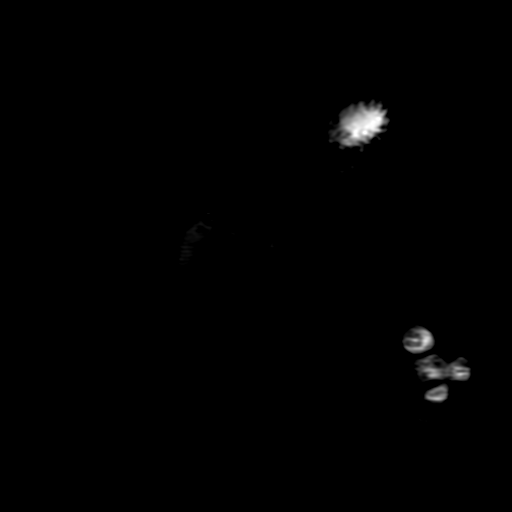

[Series 12: T1 dynamic · coronal · delayed · 5.0mm · 0.78mm/px · 1 of 88 slices shown (1 of 2)]
[im 1/88]
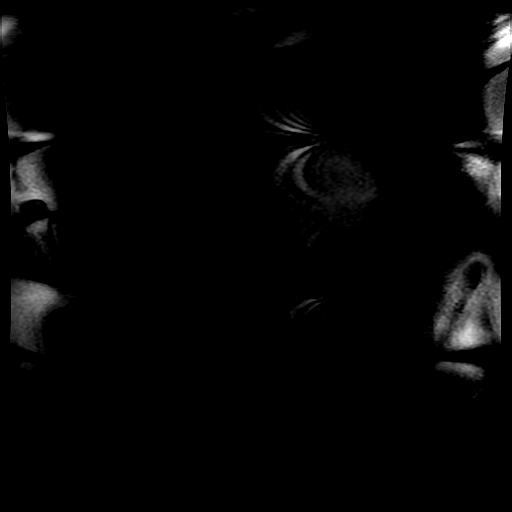

[Series 13: T1 dynamic · axial · 6.0mm · 0.78mm/px · z∈[-111,+174]mm · 2 of 96 slices shown (2 of 2)]
[im 1/96]
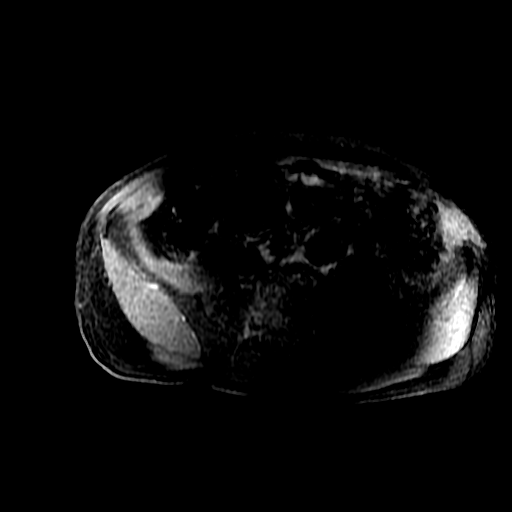
[im 96/96]
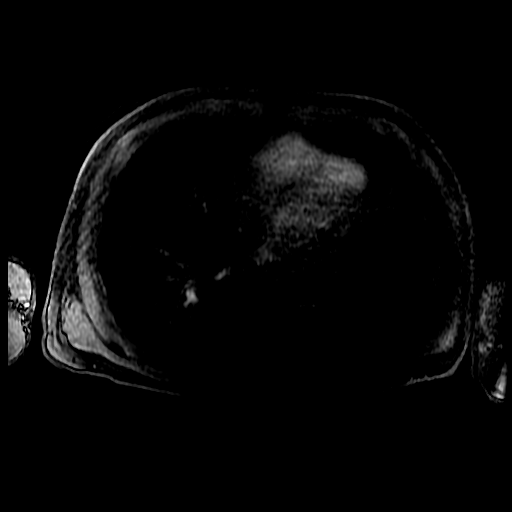

[Series 100: ((id)/(date)..96)-((id)/(id)/1..96) · axial · 6.0mm · 0.78mm/px · z∈[-111,+174]mm · 3 of 96 slices shown]
[im 1/96]
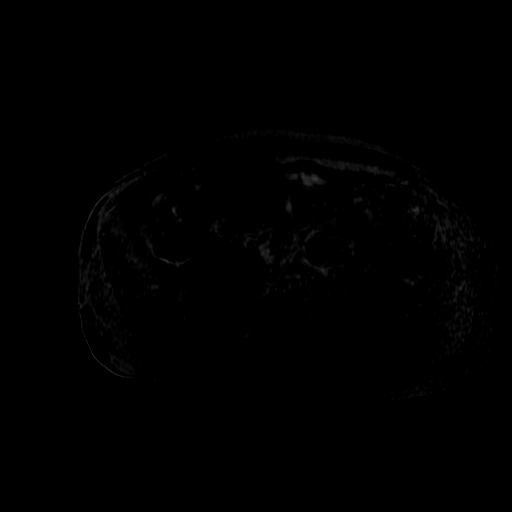
[im 48/96]
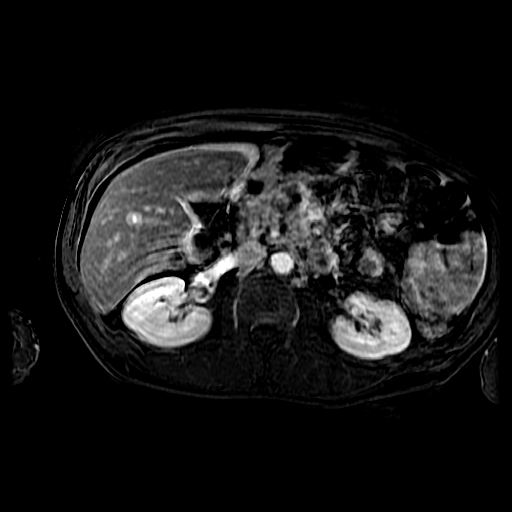
[im 96/96]
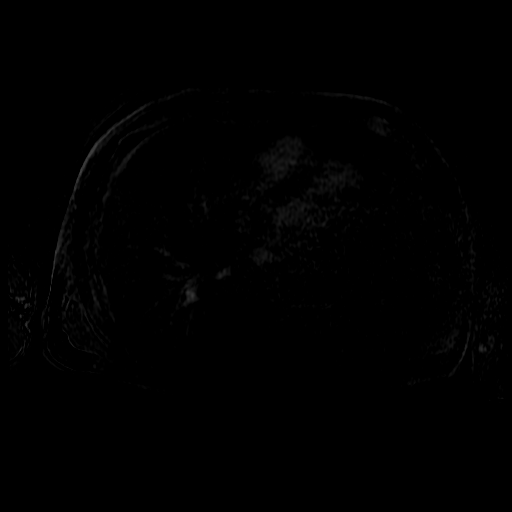

[Series 400: reformatted · axial · 1.6mm · 0.62mm/px · z∈[+30,+160]mm · 3 of 107 slices shown (1 of 2)]
[im 1/107]
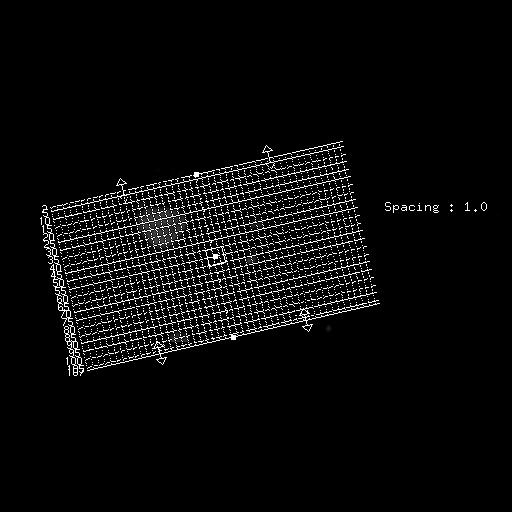
[im 54/107]
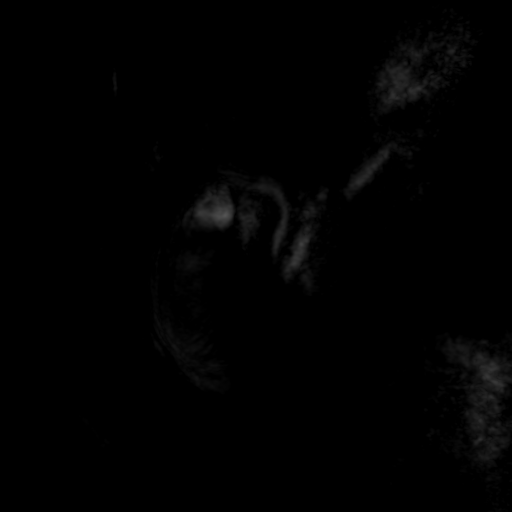
[im 107/107]
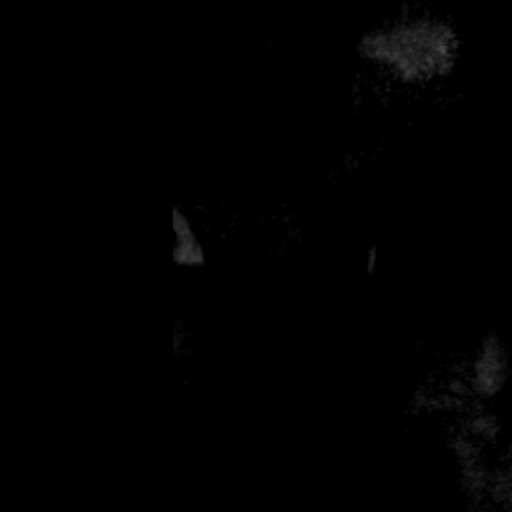

[Series 401: reformatted · coronal · 100.0mm · 0.51mm/px · 1 of 180 slices shown (2 of 2)]
[im 1/180]
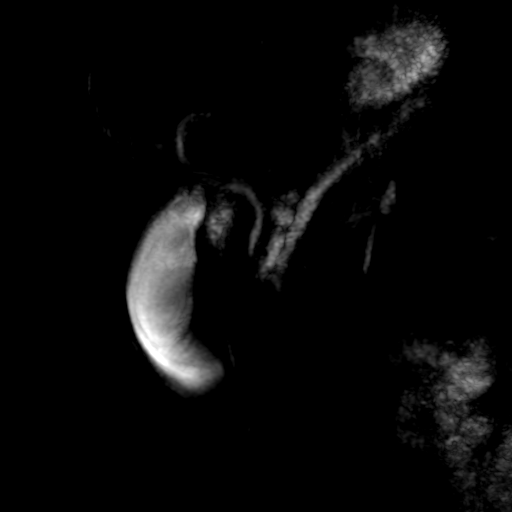

[19 of 48 positions shown; findings below may reference images not displayed]

FINDINGS: Lower chest: Limited assessment of the lower chest on MRI.

Hepatobiliary: No signs of pericholecystic inflammation. No biliary
ductal dilation. Signs of cholelithiasis. No focal hepatic lesion.

Pancreas: Intraductal calculi mainly in the accessory pancreatic
duct based on correlation with previous imaging but also within the
main pancreatic duct in the head of the pancreas. Signs of irregular
pancreatic ductal dilation. The duct appears to "penetrate" areas of
irregularity, for instance T2 signal can be seen as a continuous
finding along with low T1 signal mapping the ductal structures
within the pancreas on the current exam. Ductal interruption is a
finding more commonly associated with malignancy. This is not seen
on the current exam. No signs of pancreatic divisum.

T1 signal is low throughout the entire pancreas with the exception
of a small portion of the ventral pancreas in the uncinate process
and in the tail of the pancreas.

Peripancreatic edema is mild and likely correlates to stranding seen
on the previous study about the pancreas.

Spleen:  Within normal limits in size and appearance.

Adrenals/Urinary Tract: No masses identified. No evidence of
hydronephrosis.

Stomach/Bowel: Normal to the extent visualized. Pelvic bowel loops
are not imaged in the study was not protocol for bowel evaluation.

Vascular/Lymphatic: Vascular structures in the abdomen are patent.
Incidental note is made of a accessory left hepatic artery arising
from the left gastric artery. Two renal arteries are present on the
right there are no signs of adenopathy.

Other:  None.

Musculoskeletal: No suspicious bone lesions identified.
IMPRESSION: 1. Signs of mild acute on severe chronic pancreatitis with
intraductal calculi, no signs of pancreatic divisum. In the current
setting occult neoplasm is always difficult to entirely exclude.
Would suggest periodic follow-up based on symptoms or at
approximately 3 month interval with CT or MRI to further evaluate
above findings.
2. No signs of biliary ductal dilation.
3. Cholelithiasis.
1.

## 2019-05-27 IMAGING — MR MR 3D RECON AT [facility]
19 of 22 series · 19 of 22 positions shown · IV contrast (gadavist)
Comparison: Comparison studies from [facility] in [DATE].

CLINICAL DATA: History of chronic pancreatitis.

EXAM:
MRI ABDOMEN WITHOUT AND WITH CONTRAST (INCLUDING MRCP)
TECHNIQUE: Multiplanar multisequence MR imaging of the abdomen was performed
both before and after the administration of intravenous contrast.
Heavily T2-weighted images of the biliary and pancreatic ducts were
obtained, and three-dimensional MRCP images were rendered by post
processing.
CONTRAST:  7mL GADAVIST GADOBUTROL 1 MMOL/ML IV SOLN

[Series 1: 3 plane non · axial · 8.0mm · 0.78mm/px · 1 of 13 slices shown]
[im 1/13]
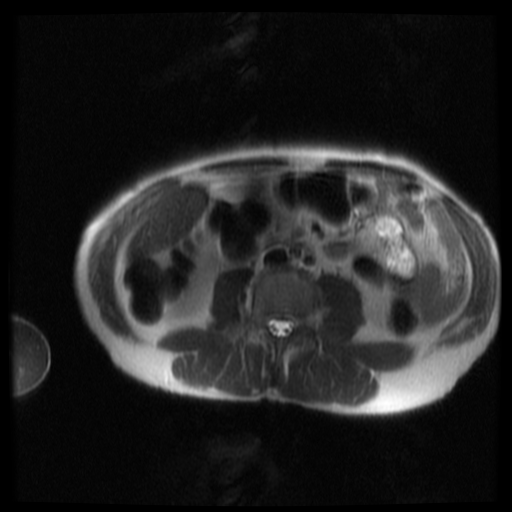

[Series 3: T2 fat-sat · axial · 5.0mm · 0.86mm/px · 1 of 56 slices shown]
[im 1/56]
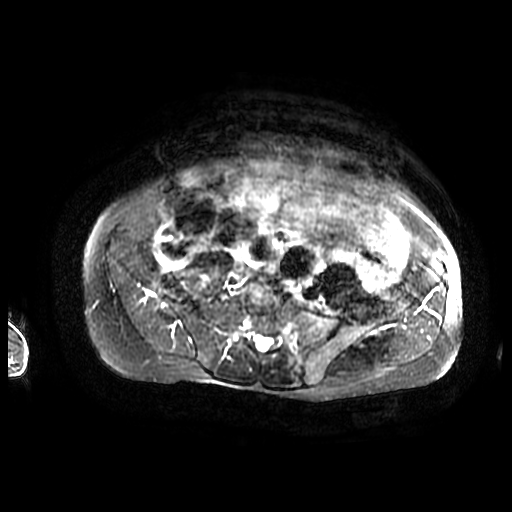

[Series 4: MRCP · coronal · 1.6mm · 0.62mm/px · 1 of 137 slices shown (1 of 2)]
[im 1/137]
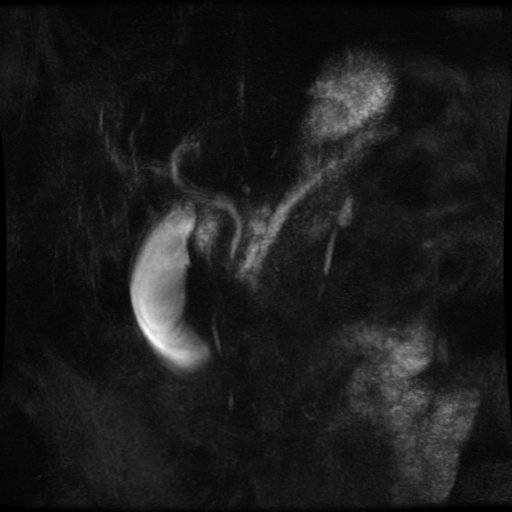

[Series 6: DWI b500 · axial · 6.0mm · 1.76mm/px · 1 of 77 slices shown]
[im 1/77]
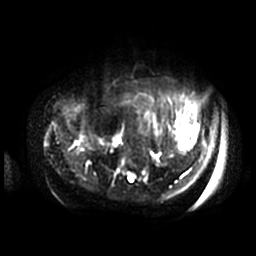

[Series 7: T2 · coronal · 5.0mm · 0.76mm/px · 1 of 47 slices shown (1 of 2)]
[im 1/47]
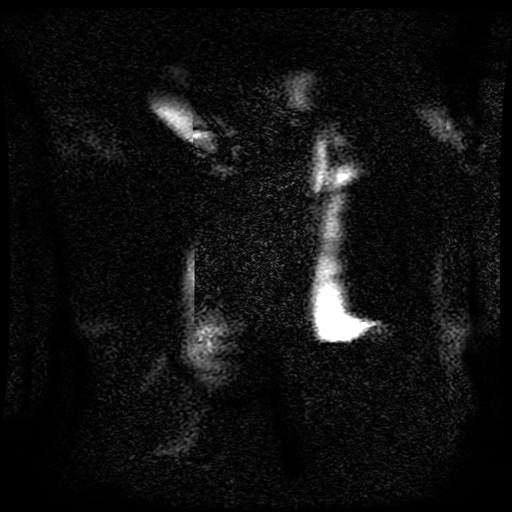

[Series 8: T2 · axial · 5.0mm · 0.86mm/px · 1 of 58 slices shown (2 of 2)]
[im 1/58]
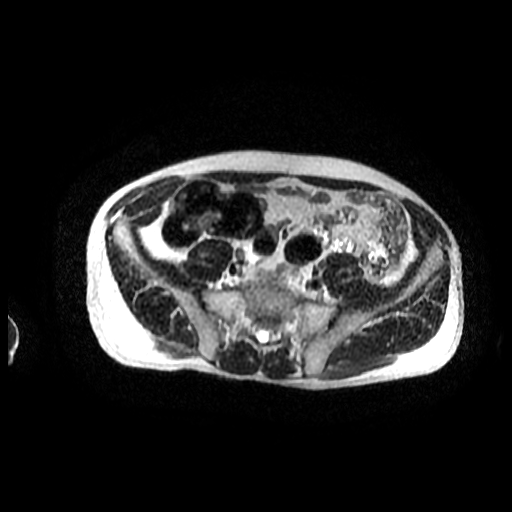

[Series 9: ax dualecho bh · axial · 5.0mm · 0.86mm/px · 1 of 112 slices shown]
[im 1/112]
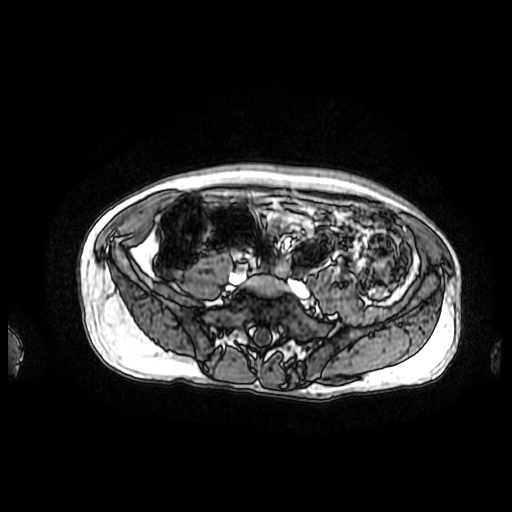

[Series 10: MRCP · coronal · 40.0mm · 0.70mm/px · 1 of 7 slices shown (2 of 2)]
[im 1/7]
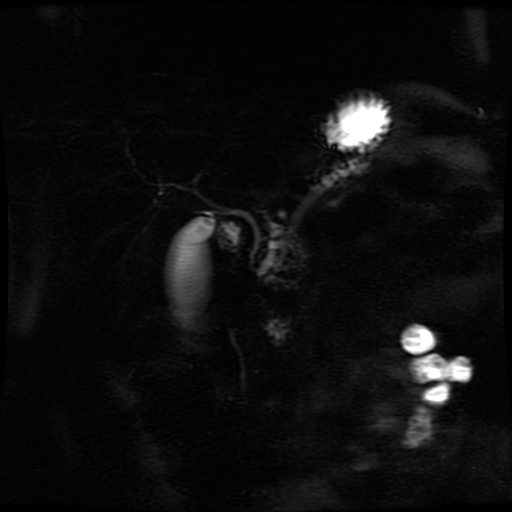

[Series 12: T1 dynamic · coronal · delayed · 5.0mm · 0.78mm/px · 1 of 88 slices shown (1 of 4)]
[im 1/88]
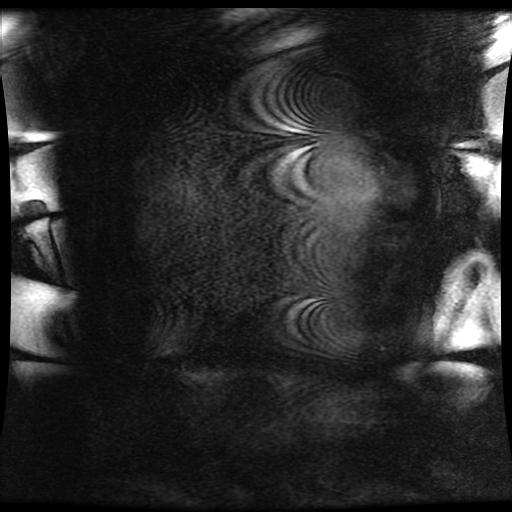

[Series 100: ((id)/(date)..96)-((id)/(id)/1..96) · axial · 6.0mm · 0.78mm/px · 1 of 96 slices shown]
[im 1/96]
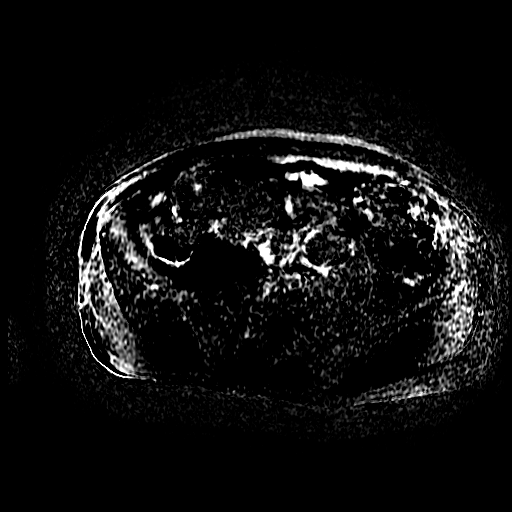

[Series 400: reformatted · axial · 1.6mm · 0.62mm/px · 1 of 107 slices shown (1 of 2)]
[im 1/107]
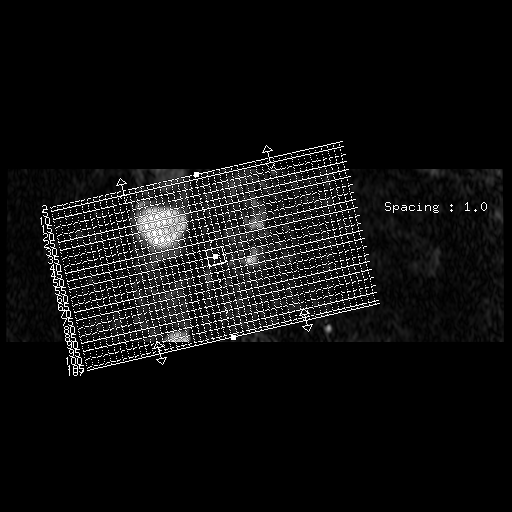

[Series 401: reformatted · coronal · 100.0mm · 0.51mm/px · 1 of 180 slices shown (2 of 2)]
[im 1/180]
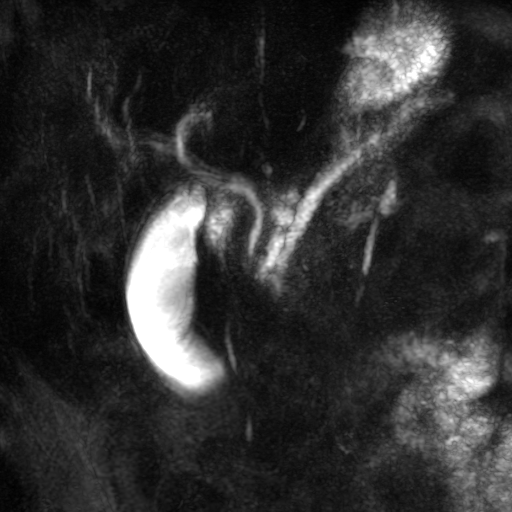

[Series 600: DWI · axial · 6.0mm · 1.76mm/px · 1 of 39 slices shown]
[im 1/39]
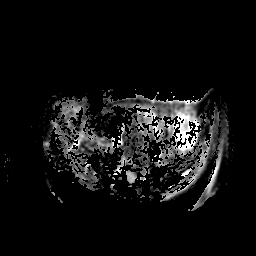

[Series 1100: T1 dynamic · axial · 6.0mm · 0.78mm/px · 1 of 96 slices shown (2 of 4)]
[im 1/96]
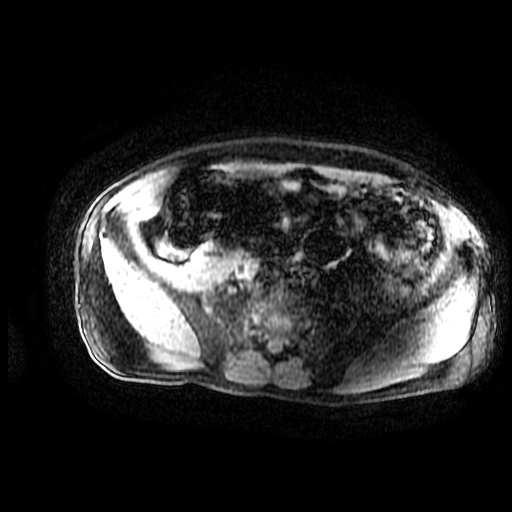

[Series 1101: T1 dynamic · axial · 6.0mm · 0.78mm/px · 1 of 96 slices shown (3 of 4)]
[im 1/96]
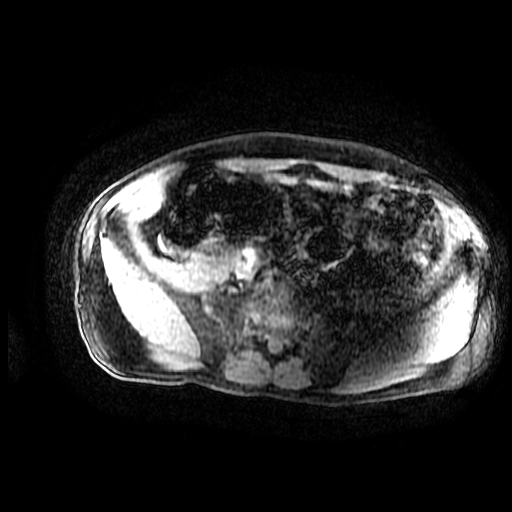

[Series 1102: T1 dynamic · axial · 6.0mm · 0.78mm/px · 1 of 96 slices shown (4 of 4)]
[im 1/96]
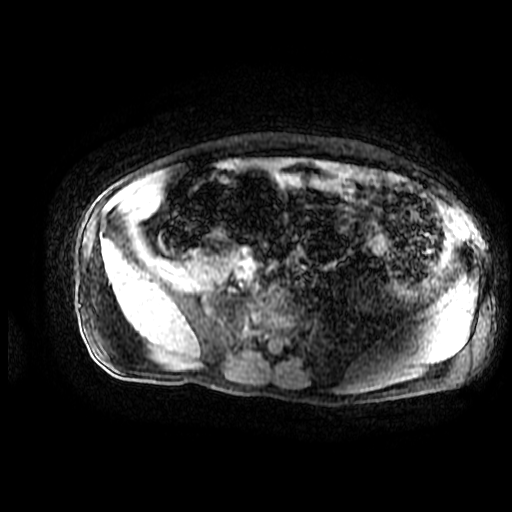

[((id)/(id)/1)-((id)/(id)/1) · axial · 6.0mm · 0.78mm/px · 1 of 96 slices shown (1 of 2)]
[im 1/96]
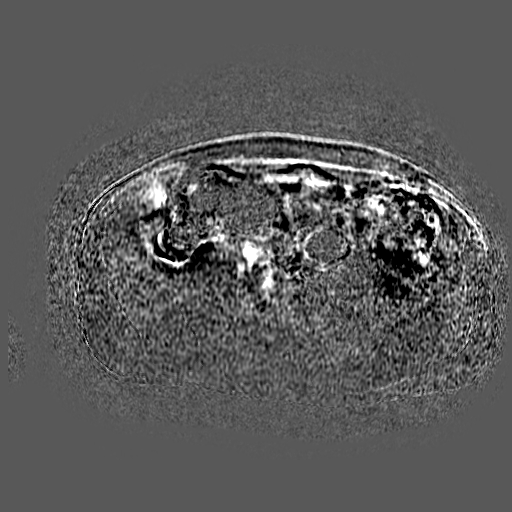

[((id)/(id)/2)-((id)/(id)/2) · axial · 6.0mm · 0.78mm/px · 1 of 96 slices shown]
[im 1/96]
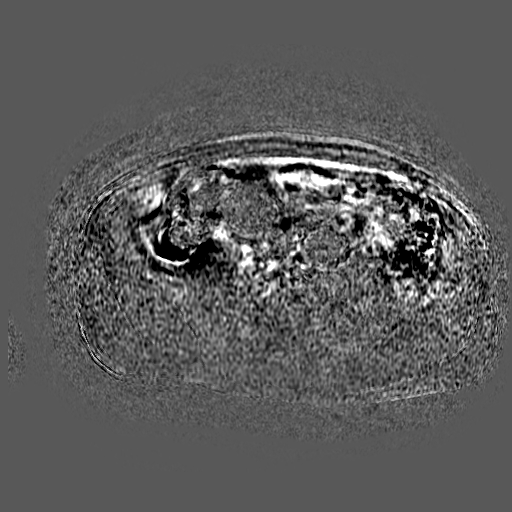

[((id)/(id)/1)-((id)/(id)/1) · axial · 6.0mm · 0.78mm/px · 1 of 96 slices shown (2 of 2)]
[im 1/96]
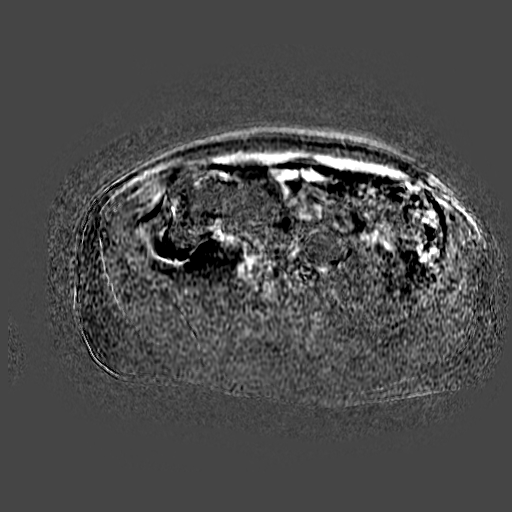

[19 of 22 positions shown; findings below may reference images not displayed]

FINDINGS: Lower chest: Limited assessment of the lower chest on MRI.

Hepatobiliary: No signs of pericholecystic inflammation. No biliary
ductal dilation. Signs of cholelithiasis. No focal hepatic lesion.

Pancreas: Intraductal calculi mainly in the accessory pancreatic
duct based on correlation with previous imaging but also within the
main pancreatic duct in the head of the pancreas. Signs of irregular
pancreatic ductal dilation. The duct appears to "penetrate" areas of
irregularity, for instance T2 signal can be seen as a continuous
finding along with low T1 signal mapping the ductal structures
within the pancreas on the current exam. Ductal interruption is a
finding more commonly associated with malignancy. This is not seen
on the current exam. No signs of pancreatic divisum.

T1 signal is low throughout the entire pancreas with the exception
of a small portion of the ventral pancreas in the uncinate process
and in the tail of the pancreas.

Peripancreatic edema is mild and likely correlates to stranding seen
on the previous study about the pancreas.

Spleen:  Within normal limits in size and appearance.

Adrenals/Urinary Tract: No masses identified. No evidence of
hydronephrosis.

Stomach/Bowel: Normal to the extent visualized. Pelvic bowel loops
are not imaged in the study was not protocol for bowel evaluation.

Vascular/Lymphatic: Vascular structures in the abdomen are patent.
Incidental note is made of a accessory left hepatic artery arising
from the left gastric artery. Two renal arteries are present on the
right there are no signs of adenopathy.

Other:  None.

Musculoskeletal: No suspicious bone lesions identified.
IMPRESSION: 1. Signs of mild acute on severe chronic pancreatitis with
intraductal calculi, no signs of pancreatic divisum. In the current
setting occult neoplasm is always difficult to entirely exclude.
Would suggest periodic follow-up based on symptoms or at
approximately 3 month interval with CT or MRI to further evaluate
above findings.
2. No signs of biliary ductal dilation.
3. Cholelithiasis.
1.

## 2019-05-27 MED ORDER — GADOBUTROL 1 MMOL/ML IV SOLN
7.0000 mL | Freq: Once | INTRAVENOUS | Status: AC | PRN
  Administered 2019-05-27: 7 mL via INTRAVENOUS

## 2019-06-09 ENCOUNTER — Other Ambulatory Visit: Payer: Self-pay

## 2019-06-09 DIAGNOSIS — K861 Other chronic pancreatitis: Secondary | ICD-10-CM

## 2019-06-16 DIAGNOSIS — Z1159 Encounter for screening for other viral diseases: Secondary | ICD-10-CM | POA: Diagnosis not present

## 2019-06-16 DIAGNOSIS — U071 COVID-19: Secondary | ICD-10-CM | POA: Diagnosis not present

## 2019-06-16 DIAGNOSIS — Z20828 Contact with and (suspected) exposure to other viral communicable diseases: Secondary | ICD-10-CM | POA: Diagnosis not present

## 2019-06-22 ENCOUNTER — Ambulatory Visit: Payer: PPO | Admitting: Physician Assistant

## 2019-06-23 ENCOUNTER — Other Ambulatory Visit: Payer: Self-pay | Admitting: Physician Assistant

## 2019-06-23 NOTE — Telephone Encounter (Signed)
Apt 01/05

## 2019-06-26 ENCOUNTER — Other Ambulatory Visit (HOSPITAL_COMMUNITY)
Admission: RE | Admit: 2019-06-26 | Discharge: 2019-06-26 | Disposition: A | Discharge status: Home / Self Care | Payer: PPO | Source: Ambulatory Visit | Attending: Gastroenterology | Admitting: Gastroenterology

## 2019-06-26 DIAGNOSIS — Z20828 Contact with and (suspected) exposure to other viral communicable diseases: Secondary | ICD-10-CM | POA: Diagnosis not present

## 2019-06-26 DIAGNOSIS — Z01812 Encounter for preprocedural laboratory examination: Secondary | ICD-10-CM | POA: Diagnosis not present

## 2019-06-27 LAB — NOVEL CORONAVIRUS, NAA (HOSP ORDER, SEND-OUT TO REF LAB; TAT 18-24 HRS): SARS-CoV-2, NAA: NOT DETECTED

## 2019-06-29 NOTE — Progress Notes (Signed)
Attempted pre-op call for endo procedure tomorrow 12/23, no answer and voicemail full.

## 2019-06-29 NOTE — Progress Notes (Signed)
Voice mail full unable to leave message 12-21- and 12-22 for endo pre op call for 06-30-19

## 2019-06-30 ENCOUNTER — Encounter (HOSPITAL_COMMUNITY)
Admission: RE | Disposition: A | Discharge status: Home / Self Care | Payer: Self-pay | Source: Home / Self Care | Attending: Gastroenterology

## 2019-06-30 ENCOUNTER — Encounter (HOSPITAL_COMMUNITY): Payer: Self-pay | Admitting: Gastroenterology

## 2019-06-30 ENCOUNTER — Ambulatory Visit (HOSPITAL_COMMUNITY): Payer: PPO | Admitting: Certified Registered Nurse Anesthetist

## 2019-06-30 ENCOUNTER — Other Ambulatory Visit: Payer: Self-pay

## 2019-06-30 ENCOUNTER — Ambulatory Visit (HOSPITAL_COMMUNITY)
Admission: RE | Admit: 2019-06-30 | Discharge: 2019-06-30 | Disposition: A | Discharge status: Home / Self Care | Payer: PPO | Attending: Gastroenterology | Admitting: Gastroenterology

## 2019-06-30 DIAGNOSIS — K449 Diaphragmatic hernia without obstruction or gangrene: Secondary | ICD-10-CM | POA: Diagnosis not present

## 2019-06-30 DIAGNOSIS — K8689 Other specified diseases of pancreas: Secondary | ICD-10-CM | POA: Diagnosis not present

## 2019-06-30 DIAGNOSIS — K3189 Other diseases of stomach and duodenum: Secondary | ICD-10-CM | POA: Diagnosis not present

## 2019-06-30 DIAGNOSIS — E119 Type 2 diabetes mellitus without complications: Secondary | ICD-10-CM | POA: Insufficient documentation

## 2019-06-30 DIAGNOSIS — F17210 Nicotine dependence, cigarettes, uncomplicated: Secondary | ICD-10-CM | POA: Diagnosis not present

## 2019-06-30 DIAGNOSIS — R932 Abnormal findings on diagnostic imaging of liver and biliary tract: Secondary | ICD-10-CM | POA: Diagnosis not present

## 2019-06-30 DIAGNOSIS — K861 Other chronic pancreatitis: Secondary | ICD-10-CM | POA: Diagnosis not present

## 2019-06-30 DIAGNOSIS — K209 Esophagitis, unspecified without bleeding: Secondary | ICD-10-CM | POA: Insufficient documentation

## 2019-06-30 DIAGNOSIS — K2950 Unspecified chronic gastritis without bleeding: Secondary | ICD-10-CM | POA: Insufficient documentation

## 2019-06-30 DIAGNOSIS — Z85828 Personal history of other malignant neoplasm of skin: Secondary | ICD-10-CM | POA: Insufficient documentation

## 2019-06-30 DIAGNOSIS — K862 Cyst of pancreas: Secondary | ICD-10-CM

## 2019-06-30 DIAGNOSIS — K2990 Gastroduodenitis, unspecified, without bleeding: Secondary | ICD-10-CM | POA: Diagnosis not present

## 2019-06-30 HISTORY — PX: BIOPSY: SHX5522

## 2019-06-30 HISTORY — PX: ESOPHAGOGASTRODUODENOSCOPY: SHX5428

## 2019-06-30 HISTORY — PX: EUS: SHX5427

## 2019-06-30 LAB — GLUCOSE, CAPILLARY: Glucose-Capillary: 123 mg/dL — ABNORMAL HIGH (ref 70–99)

## 2019-06-30 SURGERY — UPPER ENDOSCOPIC ULTRASOUND (EUS) RADIAL
Anesthesia: Monitor Anesthesia Care

## 2019-06-30 MED ORDER — PROPOFOL 500 MG/50ML IV EMUL
INTRAVENOUS | Status: AC
  Filled 2019-06-30: qty 50

## 2019-06-30 MED ORDER — LACTATED RINGERS IV SOLN: INTRAVENOUS | Status: DC | PRN

## 2019-06-30 MED ORDER — PROPOFOL 500 MG/50ML IV EMUL
INTRAVENOUS | Status: DC | PRN
  Administered 2019-06-30: 150 ug/kg/min via INTRAVENOUS

## 2019-06-30 MED ORDER — OMEPRAZOLE 40 MG PO CPDR: 40.0000 mg | DELAYED_RELEASE_CAPSULE | Freq: Every day | ORAL | 3 refills | Status: AC

## 2019-06-30 MED ORDER — SODIUM CHLORIDE 0.9 % IV SOLN: INTRAVENOUS | Status: DC

## 2019-06-30 MED ORDER — PROPOFOL 10 MG/ML IV BOLUS
INTRAVENOUS | Status: AC
  Filled 2019-06-30: qty 20

## 2019-06-30 NOTE — Transfer of Care (Signed)
Immediate Anesthesia Transfer of Care Note  Patient: Randy Richards  Procedure(s) Performed: UPPER ENDOSCOPIC ULTRASOUND (EUS) RADIAL (N/A ) BIOPSY ESOPHAGOGASTRODUODENOSCOPY (EGD) (N/A )  Patient Location: Endoscopy Unit  Anesthesia Type:MAC  Level of Consciousness: drowsy  Airway & Oxygen Therapy: Patient Spontanous Breathing and Patient connected to nasal cannula oxygen  Post-op Assessment: Report given to RN and Post -op Vital signs reviewed and stable  Post vital signs: Reviewed and stable  Last Vitals:  Vitals Value Taken Time  BP    Temp    Pulse    Resp    SpO2      Last Pain:  Vitals:   06/30/19 0853  TempSrc: Oral  PainSc: 0-No pain         Complications: No apparent anesthesia complications
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
## 2019-06-30 NOTE — Discharge Instructions (Signed)
YOU HAD AN ENDOSCOPIC PROCEDURE TODAY: Refer to the procedure report and other information in the discharge instructions given to you for any specific questions about what was found during the examination. If this information does not answer your questions, please call [place] office at [phone] to clarify.   YOU SHOULD EXPECT: Some feelings of bloating in the abdomen. Passage of more gas than usual. Walking can help get rid of the air that was put into your GI tract during the procedure and reduce the bloating. If you had a lower endoscopy (such as a colonoscopy or flexible sigmoidoscopy) you may notice spotting of blood in your stool or on the toilet paper. Some abdominal soreness may be present for a day or two, also.  DIET: Your first meal following the procedure should be a light meal and then it is ok to progress to your normal diet. A half-sandwich or bowl of soup is an example of a good first meal. Heavy or fried foods are harder to digest and may make you feel nauseous or bloated. Drink plenty of fluids but you should avoid alcoholic beverages for 24 hours. If you had a esophageal dilation, please see attached instructions for diet.    ACTIVITY: Your care partner should take you home directly after the procedure. You should plan to take it easy, moving slowly for the rest of the day. You can resume normal activity the day after the procedure however YOU SHOULD NOT DRIVE, use power tools, machinery or perform tasks that involve climbing or major physical exertion for 24 hours (because of the sedation medicines used during the test).   SYMPTOMS TO REPORT IMMEDIATELY: A gastroenterologist can be reached at any hour. Please call [phone]  for any of the following symptoms:   Following upper endoscopy (EGD, EUS, ERCP, esophageal dilation) Vomiting of blood or coffee ground material  New, significant abdominal pain  New, significant chest pain or pain under the shoulder blades  Painful or  persistently difficult swallowing  New shortness of breath  Black, tarry-looking or red, bloody stools  FOLLOW UP:  If any biopsies were taken you will be contacted by phone or by letter within the next 1-3 weeks. Call [phone]  if you have not heard about the biopsies in 3 weeks.  Please also call with any specific questions about appointments or follow up tests.  

## 2019-06-30 NOTE — Anesthesia Postprocedure Evaluation (Signed)
Anesthesia Post Note  Patient: Randy Richards  Procedure(s) Performed: UPPER ENDOSCOPIC ULTRASOUND (EUS) RADIAL (N/A ) BIOPSY ESOPHAGOGASTRODUODENOSCOPY (EGD) (N/A )     Patient location during evaluation: PACU Anesthesia Type: MAC Level of consciousness: awake and alert Pain management: pain level controlled Vital Signs Assessment: post-procedure vital signs reviewed and stable Respiratory status: spontaneous breathing, nonlabored ventilation, respiratory function stable and patient connected to nasal cannula oxygen Cardiovascular status: blood pressure returned to baseline and stable Postop Assessment: no apparent nausea or vomiting Anesthetic complications: no    Last Vitals:  Vitals:   06/30/19 1200 06/30/19 1210  BP: 131/81 (!) 152/77  Pulse: (!) 56 (!) 51  Resp: 15 17  Temp:    SpO2: 100% 100%    Last Pain:  Vitals:   06/30/19 1210  TempSrc:   PainSc: 0-No pain                 Iysha Mishkin COKER

## 2019-06-30 NOTE — Anesthesia Preprocedure Evaluation (Signed)
Anesthesia Evaluation  Patient identified by MRN, date of birth, ID band Patient awake    Reviewed: Allergy & Precautions, NPO status , Patient's Chart, lab work & pertinent test results  Airway Mallampati: II  TM Distance: >3 FB Neck ROM: Full    Dental  (+) Edentulous Upper, Edentulous Lower   Pulmonary Current Smoker,    breath sounds clear to auscultation       Cardiovascular  Rhythm:Regular Rate:Normal     Neuro/Psych    GI/Hepatic   Endo/Other  diabetes  Renal/GU      Musculoskeletal   Abdominal   Peds  Hematology   Anesthesia Other Findings   Reproductive/Obstetrics                             Anesthesia Physical Anesthesia Plan  ASA: III  Anesthesia Plan: MAC   Post-op Pain Management:    Induction: Intravenous  PONV Risk Score and Plan: Ondansetron and Propofol infusion  Airway Management Planned: Nasal Cannula and Natural Airway  Additional Equipment:   Intra-op Plan:   Post-operative Plan:   Informed Consent: I have reviewed the patients History and Physical, chart, labs and discussed the procedure including the risks, benefits and alternatives for the proposed anesthesia with the patient or authorized representative who has indicated his/her understanding and acceptance.       Plan Discussed with: CRNA and Anesthesiologist  Anesthesia Plan Comments:         Anesthesia Quick Evaluation

## 2019-06-30 NOTE — Op Note (Signed)
[facility] Patient Name: Randy Richards Procedure Date: 06/30/2019 MRN: [id number] Attending MD: Justice Britain , MD Date of Birth: 1971/06/02 CSN: [id number] Age: 48 Admit Type: Outpatient Procedure:                Upper EUS Indications:              Abnormal MRCP, Dilated pancreatic duct on MRI,                            Chronic recurrent pancreatitis Providers:                Justice Britain, MD, Burtis Junes, RN, Elspeth Cho Tech., Technician, Herbie Drape, CRNA Referring MD:              Medicines:                Monitored Anesthesia Care Complications:            No immediate complications. Estimated Blood Loss:     Estimated blood loss was minimal. Procedure:                Pre-Anesthesia Assessment:                           - Prior to the procedure, a History and Physical                            was performed, and patient medications and                            allergies were reviewed. The patient's tolerance of                            previous anesthesia was also reviewed. The risks                            and benefits of the procedure and the sedation                            options and risks were discussed with the patient.                            All questions were answered, and informed consent                            was obtained. Prior Anticoagulants: The patient has                            taken no previous anticoagulant or antiplatelet                            agents except for aspirin. ASA Grade Assessment:  III - A patient with severe systemic disease. After                            reviewing the risks and benefits, the patient was                            deemed in satisfactory condition to undergo the                            procedure.                           After obtaining informed consent, the endoscope was                            passed  under direct vision. Throughout the                            procedure, the patient's blood pressure, pulse, and                            oxygen saturations were monitored continuously. The                            [id number]-[id number] ([id number]) Olympus duodenoscope was                            introduced through the mouth, and advanced to the                            second part of duodenum. The [id number]-H190 ([id number])                            Olympus gastroscope was introduced through the                            mouth, and advanced to the second part of duodenum.                            The [id number] ([id number]) Olympus Linear EUS was                            introduced through the mouth, and advanced to the                            duodenum for ultrasound examination from the                            stomach and duodenum. The upper EUS was                            accomplished without difficulty. The patient  tolerated the procedure. Scope In: Scope Out: Findings:      ENDOSCOPIC FINDING: :      No gross lesions were noted in the proximal esophagus and in the mid       esophagus.      LA Grade C (one or more mucosal breaks continuous between tops of 2 or       more mucosal folds, less than 75% circumference) esophagitis with no       bleeding was found at the gastroesophageal junction.      A 3 cm hiatal hernia was present.      Patchy moderately erythematous mucosa without bleeding was found in the       gastric body and in the gastric antrum. Biopsies were taken with a cold       forceps for histology and Helicobacter pylori testing.      Localized moderately erythematous mucosa without active bleeding and       with no stigmata of bleeding was found in the duodenal bulb. Biopsies       were taken with a cold forceps for histology.      The major papilla was normal and was under a large hood.      ENDOSONOGRAPHIC FINDING: :      Pancreatic  parenchymal abnormalities were noted in the entire pancreas.       These consisted of hyperechoic foci with shadowing, lobularity with       honeycombing, cysts (largest cyst was 11.6 mm x 9.8 mm without evidence       of any intramural lesion) and hyperechoic strands. The changes noted       throughout the pancreas decrease the sensitivity of finding subtle       lesions due to the shadowing effect.      A few hyperechoic foci measuring up to eight mm in diameter suggestive       of stones were found in the pancreatic head and body and tail of the       pancreas. Dilated side branches were noted throughout the       neck/body/tail. The duct of [place]or[place] was dilated with what looked to be       stones and was approximately 10.6 mm in one area through the HOP. The       pancreatic duct in the head measured 10.3 mm. The pancreatic duct in the       neck measured 8.2 mm -> 7.8 mm. The pancreatic duct in the body measured       6.4 mm. The pancreatic duct in the tail measured 6.9 mm.      There was no sign of significant endosonographic abnormality in the       common bile duct. No stones were identified.      Endosonographic imaging in the visualized portion of the liver showed no       mass-lesion.      A few lymph nodes were visualized in the peripancreatic region and porta       hepatis region. The largest measured 4 mm by 6 mm in maximal       cross-sectional diameter. The nodes were triangular and isoechoic.      The celiac region was visualized. Impression:               EGD Impression:                           -  No gross lesions in esophagus proximally. LA                            Grade C esophagitis with no bleeding distally at GE                            Jxn. 3 cm hiatal hernia.                           - Erythematous mucosa in the gastric body and                            antrum. Biopsied.                           - Erythematous duodenopathy. Biopsied.                            - Normal major papilla under hood.                           EUS Impression:                           - Pancreatic parenchymal abnormalities consisting                            of hyperechoic foci, lobularity with honeycombing,                            cysts and hyperechoic strands were noted in the                            entire pancreas. Consistent with Chronic                            Pancreatitis. Sensitivity is decreased for findings                            of subtle lesions due to shadowing effect                            throughout the HOP.                           - Findings suggestive of pancreatic stones were                            identified in the pancreatic head and body and tail                            of the pancreas.                           - There was no sign of significant pathology in the  common bile duct.                           - A few lymph nodes were visualized in the                            peripancreatic region and porta hepatis region.                            Benign, inflammatory appearing. Moderate Sedation:      Not Applicable - Patient had care per Anesthesia. Recommendation:           - The patient will be observed post-procedure,                            until all discharge criteria are met.                           - Discharge patient to home.                           - Patient has a contact number available for                            emergencies. The signs and symptoms of potential                            delayed complications were discussed with the                            patient. Return to normal activities tomorrow.                            Written discharge instructions were provided to the                            patient.                           - Resume previous diet.                           - Observe patient's clinical course.                           -  Increase PPI to 40 mg daily (new Rx sent).                           - Await path results.                           - Will discuss with primary GI, consideration of                            surgical referral to discuss role of Pancreatic  [place] vs attempt at ERCP with stenting for PD                            stones. Hard to assess due to the patient's                            clinical status if his chronic back pain is a                            result of recurrent pancreatitis from stones or                            other etiologies. May also consider role of                            Pancreatic Enzyme replacement therapy, since some                            patients may have benefit from this at dosing of at                            least 72K with each meal and 36K with snacks (will                            have to monitor his stools since notation by                            Guardian of constipation at times).                           - The findings and recommendations were discussed                            with the patient.                           - The findings and recommendations were discussed                            with the designated responsible adult. Procedure Code(s):        --- Professional ---                           [phone], Esophagogastroduodenoscopy, flexible,                            transoral; with endoscopic ultrasound examination                            limited to the esophagus, stomach or duodenum, and                            adjacent structures  10071, Esophagogastroduodenoscopy, flexible,                            transoral; with biopsy, single or multiple Diagnosis Code(s):        --- Professional ---                           K20.90, Esophagitis, unspecified without bleeding                           K44.9, Diaphragmatic hernia without obstruction or                             gangrene                           K31.89, Other diseases of stomach and duodenum                           K86.2, Cyst of pancreas                           K86.9, Disease of pancreas, unspecified                           R59.0, Localized enlarged lymph nodes                           K86.89, Other specified diseases of pancreas                           K86.1, Other chronic pancreatitis                           R93.3, Abnormal findings on diagnostic imaging of                            other parts of digestive tract                           R93.2, Abnormal findings on diagnostic imaging of                            liver and biliary tract CPT copyright 2019 [employer]. All rights reserved. The codes documented in this report are preliminary and upon coder review may  be revised to meet current compliance requirements. Justice Britain, MD 06/30/2019 11:59:23 AM Number of Addenda: 0

## 2019-07-01 ENCOUNTER — Other Ambulatory Visit: Payer: Self-pay

## 2019-07-01 LAB — SURGICAL PATHOLOGY

## 2019-07-05 ENCOUNTER — Encounter: Payer: Self-pay | Admitting: Gastroenterology

## 2019-07-13 ENCOUNTER — Other Ambulatory Visit: Payer: Self-pay

## 2019-07-13 ENCOUNTER — Ambulatory Visit (INDEPENDENT_AMBULATORY_CARE_PROVIDER_SITE_OTHER): Payer: PPO | Admitting: Physician Assistant

## 2019-07-13 ENCOUNTER — Encounter: Payer: Self-pay | Admitting: Physician Assistant

## 2019-07-13 DIAGNOSIS — G4700 Insomnia, unspecified: Secondary | ICD-10-CM | POA: Diagnosis not present

## 2019-07-13 DIAGNOSIS — F331 Major depressive disorder, recurrent, moderate: Secondary | ICD-10-CM

## 2019-07-13 DIAGNOSIS — F429 Obsessive-compulsive disorder, unspecified: Secondary | ICD-10-CM

## 2019-07-13 DIAGNOSIS — F4310 Post-traumatic stress disorder, unspecified: Secondary | ICD-10-CM | POA: Diagnosis not present

## 2019-07-13 MED ORDER — SERTRALINE HCL 100 MG PO TABS: ORAL_TABLET | ORAL | 5 refills | Status: DC

## 2019-07-13 MED ORDER — BUSPIRONE HCL 10 MG PO TABS: 10.0000 mg | ORAL_TABLET | Freq: Two times a day (BID) | ORAL | 5 refills | Status: DC

## 2019-07-13 MED ORDER — DIAZEPAM 5 MG PO TABS: ORAL_TABLET | ORAL | 5 refills | Status: DC

## 2019-07-13 MED ORDER — TRAZODONE HCL 100 MG PO TABS: 100.0000 mg | ORAL_TABLET | Freq: Every evening | ORAL | 5 refills | Status: DC | PRN
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
## 2019-07-26 DIAGNOSIS — H5213 Myopia, bilateral: Secondary | ICD-10-CM | POA: Diagnosis not present

## 2019-07-26 DIAGNOSIS — H47213 Primary optic atrophy, bilateral: Secondary | ICD-10-CM | POA: Diagnosis not present

## 2019-08-04 DIAGNOSIS — G40209 Localization-related (focal) (partial) symptomatic epilepsy and epileptic syndromes with complex partial seizures, not intractable, without status epilepticus: Secondary | ICD-10-CM | POA: Diagnosis not present

## 2019-08-04 DIAGNOSIS — S069X9S Unspecified intracranial injury with loss of consciousness of unspecified duration, sequela: Secondary | ICD-10-CM | POA: Diagnosis not present

## 2019-08-04 DIAGNOSIS — G9349 Other encephalopathy: Secondary | ICD-10-CM | POA: Diagnosis not present

## 2019-09-28 ENCOUNTER — Other Ambulatory Visit: Payer: Self-pay | Admitting: Physician Assistant

## 2019-10-01 ENCOUNTER — Other Ambulatory Visit: Payer: Self-pay | Admitting: Physician Assistant

## 2019-10-12 DIAGNOSIS — U071 COVID-19: Secondary | ICD-10-CM | POA: Diagnosis not present

## 2019-10-12 DIAGNOSIS — Z20828 Contact with and (suspected) exposure to other viral communicable diseases: Secondary | ICD-10-CM | POA: Diagnosis not present

## 2019-10-18 ENCOUNTER — Other Ambulatory Visit: Payer: Self-pay | Admitting: Gastroenterology

## 2019-10-19 DIAGNOSIS — R221 Localized swelling, mass and lump, neck: Secondary | ICD-10-CM | POA: Diagnosis not present

## 2019-10-19 DIAGNOSIS — F17210 Nicotine dependence, cigarettes, uncomplicated: Secondary | ICD-10-CM | POA: Diagnosis not present

## 2019-10-25 ENCOUNTER — Other Ambulatory Visit: Payer: Self-pay | Admitting: Gastroenterology

## 2019-11-01 ENCOUNTER — Other Ambulatory Visit: Payer: Self-pay | Admitting: Gastroenterology

## 2019-11-08 DIAGNOSIS — R221 Localized swelling, mass and lump, neck: Secondary | ICD-10-CM | POA: Diagnosis not present

## 2019-11-12 DIAGNOSIS — R197 Diarrhea, unspecified: Secondary | ICD-10-CM | POA: Diagnosis not present

## 2019-11-12 DIAGNOSIS — E785 Hyperlipidemia, unspecified: Secondary | ICD-10-CM | POA: Diagnosis not present

## 2019-11-12 DIAGNOSIS — M545 Low back pain: Secondary | ICD-10-CM | POA: Diagnosis not present

## 2019-11-12 DIAGNOSIS — E119 Type 2 diabetes mellitus without complications: Secondary | ICD-10-CM | POA: Diagnosis not present

## 2019-11-12 DIAGNOSIS — K5900 Constipation, unspecified: Secondary | ICD-10-CM | POA: Diagnosis not present

## 2019-11-12 DIAGNOSIS — K861 Other chronic pancreatitis: Secondary | ICD-10-CM | POA: Diagnosis not present

## 2020-01-11 ENCOUNTER — Ambulatory Visit (INDEPENDENT_AMBULATORY_CARE_PROVIDER_SITE_OTHER): Payer: PPO | Admitting: Physician Assistant

## 2020-01-11 ENCOUNTER — Encounter: Payer: Self-pay | Admitting: Physician Assistant

## 2020-01-11 ENCOUNTER — Other Ambulatory Visit: Payer: Self-pay

## 2020-01-11 DIAGNOSIS — F429 Obsessive-compulsive disorder, unspecified: Secondary | ICD-10-CM

## 2020-01-11 DIAGNOSIS — G4700 Insomnia, unspecified: Secondary | ICD-10-CM

## 2020-01-11 DIAGNOSIS — F4310 Post-traumatic stress disorder, unspecified: Secondary | ICD-10-CM

## 2020-01-11 DIAGNOSIS — F3341 Major depressive disorder, recurrent, in partial remission: Secondary | ICD-10-CM | POA: Diagnosis not present

## 2020-01-11 MED ORDER — TRAZODONE HCL 100 MG PO TABS: 100.0000 mg | ORAL_TABLET | Freq: Every evening | ORAL | 5 refills | Status: DC | PRN

## 2020-01-11 MED ORDER — CARBAMAZEPINE ER 300 MG PO CP12: 300.0000 mg | ORAL_CAPSULE | Freq: Two times a day (BID) | ORAL | 5 refills | Status: DC

## 2020-01-11 MED ORDER — SERTRALINE HCL 100 MG PO TABS: ORAL_TABLET | ORAL | 5 refills | Status: DC

## 2020-01-11 MED ORDER — BUSPIRONE HCL 10 MG PO TABS: 10.0000 mg | ORAL_TABLET | Freq: Two times a day (BID) | ORAL | 5 refills | Status: DC

## 2020-01-11 MED ORDER — DIAZEPAM 5 MG PO TABS: ORAL_TABLET | ORAL | 5 refills | Status: DC

## 2020-01-11 NOTE — Progress Notes (Signed)
[facility]  Patient ID: Randy Richards,  MRN: [id number]  PCP: Imagene Riches, NP  Date of Evaluation: 01/11/2020 Time spent:20 minutes  Chief Complaint:  Chief Complaint    Medication Refill      HISTORY/CURRENT STATUS: HPI for routine med check.  Accompanied by Cleophas Dunker, CareGiver.  Pt is doing well as far as he knows.  He does enjoy going to the park and walking around sometimes, with the caregiver.  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  Denies feelings of hopelessness.  No SI/HI.  Anxiety is controlled with the Valium.  He takes it twice a day every day and about half of the time he needs the third dose.  The OCD behaviors are a lot less than they used to be.   Patient denies increased energy with decreased need for sleep, no increased talkativeness, no racing thoughts, no impulsivity or risky behaviors, no increased spending, no increased libido, no grandiosity.  Denies dizziness, syncope, seizures, numbness, tingling, tremor, tics, unsteady gait, slurred speech, confusion. Denies muscle or joint pain, stiffness, or dystonia.  Individual Medical History/ Review of Systems: Changes? :No    Past medications for mental health diagnoses include: Trazodone, BuSpar, hydroxyzine, carbamazepine for seizures, Keppra for seizures, Zoloft, Valium  Allergies: Sulfa antibiotics  Current Medications:  Current Outpatient Medications:  .  acetaminophen (TYLENOL) 500 MG tablet, Take 1,000 mg by mouth daily as needed for mild pain. , Disp: , Rfl:  .  aspirin 81 MG chewable tablet, Chew 81 mg by mouth daily. , Disp: , Rfl:  .  busPIRone (BUSPAR) 10 MG tablet, Take 1 tablet (10 mg total) by mouth 2 (two) times daily., Disp: 60 tablet, Rfl: 5 .  carbamazepine (CARBATROL) 300 MG 12 hr capsule, Take 1 capsule (300 mg total) by mouth 2 (two) times daily., Disp: 60 capsule, Rfl: 5 .  Cyanocobalamin (VITAMIN B 12 PO),  Take 1 tablet by mouth daily. , Disp: , Rfl:  .  diazepam (VALIUM) 5 MG tablet, TAKE 1 TABLET BY MOUTH EVERY 8 HOURS AS NEEDED FOR ANXIETY MUST LAST 30 DAYS, Disp: 75 tablet, Rfl: 5 .  fenofibrate 160 MG tablet, Take 160 mg by mouth daily., Disp: , Rfl:  .  levETIRAcetam (KEPPRA) 750 MG tablet, Take 750 mg by mouth 2 (two) times daily., Disp: , Rfl:  .  metFORMIN (GLUCOPHAGE-XR) 500 MG 24 hr tablet, Take 500 mg by mouth 2 (two) times daily., Disp: , Rfl:  .  Omega-3 Fatty Acids (FISH OIL) 1000 MG CAPS, Take 1,000 mg by mouth daily. , Disp: , Rfl:  .  omeprazole (PRILOSEC) 40 MG capsule, Take 1 capsule (40 mg total) by mouth daily., Disp: 30 capsule, Rfl: 3 .  polyethylene glycol (MIRALAX) packet, Take 17 g by mouth daily. (Patient taking differently: Take 17 g by mouth daily as needed for mild constipation. ), Disp: 14 each, Rfl: 0 .  Prenatal Vit-Fe Fumarate-FA (PRENATAL MULTIVITAMIN) TABS tablet, Take 1 tablet by mouth daily at 12 noon., Disp: , Rfl:  .  promethazine (PHENERGAN) 25 MG tablet, Take 1 tablet (25 mg total) by mouth every 6 (six) hours as needed for nausea or vomiting., Disp: 10 tablet, Rfl: 0 .  sertraline (ZOLOFT) 100 MG tablet, 50 mg in the morning, 100 mg at bedtime, Disp: 45 tablet, Rfl: 5 .  simvastatin (ZOCOR) 40 MG tablet, Take 40 mg by mouth every evening., Disp: ,  Rfl:  .  traZODone (DESYREL) 100 MG tablet, Take 1-2 tablets (100-200 mg total) by mouth at bedtime as needed., Disp: 60 tablet, Rfl: 5 Medication Side Effects: none  Family Medical/ Social History: Changes?  No  MENTAL HEALTH EXAM:  There were no vitals taken for this visit.There is no height or weight on file to calculate BMI.  General Appearance: Casual, Neat and Well Groomed appears to have lost weight.  Eye Contact:  Good  Speech:  Clear and Coherent and Slow  Volume:  Normal  Mood:  Euthymic  Affect:  Appropriate  Thought Process:  Goal Directed and Descriptions of Associations: Intact  Orientation:   Full (Time, Place, and Person)  Thought Content: Logical   Suicidal Thoughts:  No  Homicidal Thoughts:  No  Memory:  WNL  Judgement:  Good  Insight:  Good  Psychomotor Activity:  Normal drags his left foot  Concentration:  Concentration: Good  Recall:  Good  Fund of Knowledge: Good  Language: Good  Assets:  Desire for Improvement  ADL's:  Intact  Cognition: WNL  Prognosis:  Good   11/08/2019 biopsy on neck mass was nondiagnostic.  The ENT recommends observation.  He feels that it is likely due to scar tissue.  DIAGNOSES:    ICD-10-CM   1. PTSD (post-traumatic stress disorder)  F43.10   2. Obsessive-compulsive disorder, unspecified type  F42.9   3. Insomnia, unspecified type  G47.00   4. Recurrent major depressive disorder, in partial remission ([place])  F33.41     Receiving Psychotherapy: No    RECOMMENDATIONS:  PDMP was reviewed. I provided 20 minutes of face-to-face time during this encounter. Continue carbamazepine 300 mg, 1 p.o. twice daily. Continue BuSpar 10 mg twice daily. Continue Valium 5 mg twice daily as needed and an extra 1/2-1 in the middle of the day as needed. Continue Zoloft 100 mg, one half every morning and 1 p.o. nightly. Continue trazodone 100 mg, 1-2 nightly as needed. Return in 6 months.  Donnal Moat, PA-C

## 2020-02-08 ENCOUNTER — Ambulatory Visit: Payer: PPO | Admitting: Gastroenterology

## 2020-02-10 DIAGNOSIS — R634 Abnormal weight loss: Secondary | ICD-10-CM | POA: Diagnosis not present

## 2020-02-10 DIAGNOSIS — E119 Type 2 diabetes mellitus without complications: Secondary | ICD-10-CM | POA: Diagnosis not present

## 2020-02-10 DIAGNOSIS — K861 Other chronic pancreatitis: Secondary | ICD-10-CM | POA: Diagnosis not present

## 2020-02-22 DIAGNOSIS — K861 Other chronic pancreatitis: Secondary | ICD-10-CM | POA: Diagnosis not present

## 2020-02-22 DIAGNOSIS — I700 Atherosclerosis of aorta: Secondary | ICD-10-CM | POA: Diagnosis not present

## 2020-02-22 DIAGNOSIS — K862 Cyst of pancreas: Secondary | ICD-10-CM | POA: Diagnosis not present

## 2020-02-22 DIAGNOSIS — R634 Abnormal weight loss: Secondary | ICD-10-CM | POA: Diagnosis not present

## 2020-02-22 DIAGNOSIS — K859 Acute pancreatitis without necrosis or infection, unspecified: Secondary | ICD-10-CM | POA: Diagnosis not present

## 2020-03-01 ENCOUNTER — Ambulatory Visit: Payer: PPO | Admitting: Gastroenterology

## 2020-03-01 ENCOUNTER — Encounter: Payer: Self-pay | Admitting: Gastroenterology

## 2020-03-01 VITALS — BP 114/74 | HR 69 | Temp 97.1°F | Ht 67.0 in | Wt 149.4 lb

## 2020-03-01 DIAGNOSIS — K861 Other chronic pancreatitis: Secondary | ICD-10-CM | POA: Diagnosis not present

## 2020-03-01 MED ORDER — ZENPEP 40000-126000 UNITS PO CPEP: ORAL_CAPSULE | ORAL | 0 refills | Status: DC

## 2020-03-01 NOTE — Patient Instructions (Addendum)
If you are age 50 or older, your body mass index should be between 23-30. Your Body mass index is 23.4 kg/m. If this is out of the aforementioned range listed, please consider follow up with your Primary Care Provider.  If you are age 60 or younger, your body mass index should be between 19-25. Your Body mass index is 23.4 kg/m. If this is out of the aformentioned range listed, please consider follow up with your Primary Care Provider.   Please purchase the following medications over the counter and take as directed: Miralax 17 grams twice daily until large bowel movement, then a stool softener twice daily.   Stop smoking.   Please call Dr. Leland Her nurse Grace Bushy, RN)  in 2 weeks at [phone]  to let her now how you are doing.   We have sent the following medications to your pharmacy for you to pick up at your convenience: Zenpep 40,000 take 2 with each meal and one with snack.   Thank you,  Dr. Jackquline Denmark
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
## 2020-03-14 ENCOUNTER — Ambulatory Visit: Payer: PPO | Admitting: Nurse Practitioner

## 2020-03-22 ENCOUNTER — Other Ambulatory Visit: Payer: Self-pay | Admitting: Gastroenterology

## 2020-04-24 ENCOUNTER — Telehealth: Payer: Self-pay | Admitting: Gastroenterology

## 2020-04-24 MED ORDER — ZENPEP 40000-126000 UNITS PO CPEP: ORAL_CAPSULE | ORAL | 3 refills | Status: DC

## 2020-04-24 NOTE — Telephone Encounter (Signed)
I have sent a copay card and 90 day prescription to patients pharmacy. Patient was notified.

## 2020-04-24 NOTE — Telephone Encounter (Signed)
Patient is out of Zenpep medication requesting refill also stated the copay for it is too high wondering if there is a prior auth that can be done or coupons available

## 2020-04-26 ENCOUNTER — Telehealth: Payer: Self-pay | Admitting: Gastroenterology

## 2020-04-26 NOTE — Telephone Encounter (Signed)
Zenpep is too expensive for patient. Would you like to change patients medication? There is a patient assistance program for Creon if you would like to change patient to Creon. Please advise.

## 2020-05-01 NOTE — Telephone Encounter (Signed)
Lets get on patient assistance program for Creon Then can use (lipase 40,000 units, maximum strength) 2 with each meals and 1 with snacks for now RG

## 2020-05-02 NOTE — Telephone Encounter (Signed)
I have printed off financial assistance forms and filled out. I have tried to contact patient for his part to be completed. Unable to reach and voicemail box was full so was unable to leave message. I will attempt to call back at later time.

## 2020-05-03 NOTE — Telephone Encounter (Signed)
I have attempted to call patient was unable to reach him or leave message because voivemail was full.

## 2020-05-04 NOTE — Telephone Encounter (Signed)
I have spoke to patients caregiver, we have went over what is needed for on their part of the form. I have mailed the patient assistance form and put samples at the front desk in [facility] for patients caregiver to pick up.

## 2020-05-08 ENCOUNTER — Other Ambulatory Visit: Payer: Self-pay | Admitting: Physician Assistant

## 2020-05-08 DIAGNOSIS — Z23 Encounter for immunization: Secondary | ICD-10-CM | POA: Diagnosis not present

## 2020-05-15 DIAGNOSIS — E1165 Type 2 diabetes mellitus with hyperglycemia: Secondary | ICD-10-CM | POA: Diagnosis not present

## 2020-05-15 DIAGNOSIS — K861 Other chronic pancreatitis: Secondary | ICD-10-CM | POA: Diagnosis not present

## 2020-05-15 DIAGNOSIS — R561 Post traumatic seizures: Secondary | ICD-10-CM | POA: Diagnosis not present

## 2020-05-15 DIAGNOSIS — R799 Abnormal finding of blood chemistry, unspecified: Secondary | ICD-10-CM | POA: Diagnosis not present

## 2020-05-15 DIAGNOSIS — E785 Hyperlipidemia, unspecified: Secondary | ICD-10-CM | POA: Diagnosis not present

## 2020-05-15 DIAGNOSIS — Z87820 Personal history of traumatic brain injury: Secondary | ICD-10-CM | POA: Diagnosis not present

## 2020-05-15 DIAGNOSIS — G4700 Insomnia, unspecified: Secondary | ICD-10-CM | POA: Diagnosis not present

## 2020-06-09 DIAGNOSIS — L84 Corns and callosities: Secondary | ICD-10-CM | POA: Diagnosis not present

## 2020-06-09 DIAGNOSIS — E1165 Type 2 diabetes mellitus with hyperglycemia: Secondary | ICD-10-CM | POA: Diagnosis not present

## 2020-07-03 DIAGNOSIS — E119 Type 2 diabetes mellitus without complications: Secondary | ICD-10-CM | POA: Diagnosis not present

## 2020-07-03 DIAGNOSIS — L84 Corns and callosities: Secondary | ICD-10-CM | POA: Diagnosis not present

## 2020-07-08 DIAGNOSIS — Z87820 Personal history of traumatic brain injury: Secondary | ICD-10-CM | POA: Diagnosis not present

## 2020-07-08 DIAGNOSIS — S0012XA Contusion of left eyelid and periocular area, initial encounter: Secondary | ICD-10-CM | POA: Diagnosis not present

## 2020-07-08 DIAGNOSIS — W19XXXA Unspecified fall, initial encounter: Secondary | ICD-10-CM | POA: Diagnosis not present

## 2020-07-08 DIAGNOSIS — Z043 Encounter for examination and observation following other accident: Secondary | ICD-10-CM | POA: Diagnosis not present

## 2020-07-08 DIAGNOSIS — S0990XA Unspecified injury of head, initial encounter: Secondary | ICD-10-CM | POA: Diagnosis not present

## 2020-07-08 DIAGNOSIS — I959 Hypotension, unspecified: Secondary | ICD-10-CM | POA: Diagnosis not present

## 2020-07-08 DIAGNOSIS — R0989 Other specified symptoms and signs involving the circulatory and respiratory systems: Secondary | ICD-10-CM | POA: Diagnosis not present

## 2020-07-08 DIAGNOSIS — R404 Transient alteration of awareness: Secondary | ICD-10-CM | POA: Diagnosis not present

## 2020-07-08 DIAGNOSIS — Z20822 Contact with and (suspected) exposure to covid-19: Secondary | ICD-10-CM | POA: Diagnosis not present

## 2020-07-08 DIAGNOSIS — R9431 Abnormal electrocardiogram [ECG] [EKG]: Secondary | ICD-10-CM | POA: Diagnosis not present

## 2020-07-08 DIAGNOSIS — I491 Atrial premature depolarization: Secondary | ICD-10-CM | POA: Diagnosis not present

## 2020-07-09 DIAGNOSIS — I491 Atrial premature depolarization: Secondary | ICD-10-CM | POA: Diagnosis not present

## 2020-07-11 ENCOUNTER — Ambulatory Visit: Payer: PPO | Admitting: Physician Assistant

## 2020-07-17 DIAGNOSIS — I499 Cardiac arrhythmia, unspecified: Secondary | ICD-10-CM | POA: Diagnosis not present

## 2020-07-17 DIAGNOSIS — E1165 Type 2 diabetes mellitus with hyperglycemia: Secondary | ICD-10-CM | POA: Diagnosis not present

## 2020-07-17 DIAGNOSIS — E118 Type 2 diabetes mellitus with unspecified complications: Secondary | ICD-10-CM | POA: Diagnosis not present

## 2020-07-17 DIAGNOSIS — I959 Hypotension, unspecified: Secondary | ICD-10-CM | POA: Diagnosis not present

## 2020-07-17 DIAGNOSIS — Z79899 Other long term (current) drug therapy: Secondary | ICD-10-CM | POA: Diagnosis not present

## 2020-07-17 DIAGNOSIS — R634 Abnormal weight loss: Secondary | ICD-10-CM | POA: Diagnosis not present

## 2020-07-31 DIAGNOSIS — R748 Abnormal levels of other serum enzymes: Secondary | ICD-10-CM | POA: Diagnosis not present

## 2020-08-02 ENCOUNTER — Other Ambulatory Visit: Payer: Self-pay | Admitting: Physician Assistant

## 2020-08-04 DIAGNOSIS — U071 COVID-19: Secondary | ICD-10-CM | POA: Diagnosis not present

## 2020-08-04 DIAGNOSIS — Z87820 Personal history of traumatic brain injury: Secondary | ICD-10-CM | POA: Diagnosis not present

## 2020-08-04 DIAGNOSIS — E1165 Type 2 diabetes mellitus with hyperglycemia: Secondary | ICD-10-CM | POA: Diagnosis not present

## 2020-08-04 DIAGNOSIS — K861 Other chronic pancreatitis: Secondary | ICD-10-CM | POA: Diagnosis not present

## 2020-08-04 DIAGNOSIS — Z20828 Contact with and (suspected) exposure to other viral communicable diseases: Secondary | ICD-10-CM | POA: Diagnosis not present

## 2020-08-04 DIAGNOSIS — R634 Abnormal weight loss: Secondary | ICD-10-CM | POA: Diagnosis not present

## 2020-08-04 DIAGNOSIS — R051 Acute cough: Secondary | ICD-10-CM | POA: Diagnosis not present

## 2020-08-17 DIAGNOSIS — K5900 Constipation, unspecified: Secondary | ICD-10-CM | POA: Diagnosis not present

## 2020-08-17 DIAGNOSIS — K861 Other chronic pancreatitis: Secondary | ICD-10-CM | POA: Diagnosis not present

## 2020-08-17 DIAGNOSIS — E1165 Type 2 diabetes mellitus with hyperglycemia: Secondary | ICD-10-CM | POA: Diagnosis not present

## 2020-08-21 ENCOUNTER — Ambulatory Visit (INDEPENDENT_AMBULATORY_CARE_PROVIDER_SITE_OTHER): Payer: PPO | Admitting: Gastroenterology

## 2020-08-21 ENCOUNTER — Encounter: Payer: Self-pay | Admitting: Gastroenterology

## 2020-08-21 VITALS — BP 110/68 | HR 70 | Ht 69.5 in | Wt 139.0 lb

## 2020-08-21 DIAGNOSIS — R634 Abnormal weight loss: Secondary | ICD-10-CM

## 2020-08-21 DIAGNOSIS — R197 Diarrhea, unspecified: Secondary | ICD-10-CM

## 2020-08-21 DIAGNOSIS — K861 Other chronic pancreatitis: Secondary | ICD-10-CM | POA: Diagnosis not present

## 2020-08-21 MED ORDER — ZENPEP 40000-126000 UNITS PO CPEP: ORAL_CAPSULE | ORAL | 3 refills | Status: DC

## 2020-08-21 NOTE — Patient Instructions (Addendum)
If you are age 51 or older, your body mass index should be between 23-30. Your Body mass index is 20.23 kg/m. If this is out of the aforementioned range listed, please consider follow up with your Primary Care Provider.  If you are age 1 or younger, your body mass index should be between 19-25. Your Body mass index is 20.23 kg/m. If this is out of the aformentioned range listed, please consider follow up with your Primary Care Provider.   You have been scheduled for an MRI at [facility] on 09/04/2020 . Your appointment time is 12pm. Please arrive 15 minutes prior to your appointment time for registration purposes. Please make certain not to have anything to eat or drink 4 hours prior to your test. In addition, if you have any metal in your body, have a pacemaker or defibrillator, please be sure to let your ordering physician know. This test typically takes 45 minutes to 1 hour to complete. Should you need to reschedule, please call [phone] to do so.  Stop smoking We have given lab requisition forms to take with you  Small frequent meals Continue tramadol as needed Stop stool softners  Follow up in 6 months. Call with any questions
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
## 2020-08-23 DIAGNOSIS — R197 Diarrhea, unspecified: Secondary | ICD-10-CM | POA: Diagnosis not present

## 2020-08-23 DIAGNOSIS — K861 Other chronic pancreatitis: Secondary | ICD-10-CM | POA: Diagnosis not present

## 2020-08-23 DIAGNOSIS — R634 Abnormal weight loss: Secondary | ICD-10-CM | POA: Diagnosis not present

## 2020-08-25 ENCOUNTER — Telehealth: Payer: Self-pay | Admitting: Gastroenterology

## 2020-08-25 NOTE — Telephone Encounter (Signed)
Patient had his labs done at his PCP. They will fax the results to our office for Dr Lyndel Safe to review

## 2020-08-25 NOTE — Telephone Encounter (Signed)
Inbound call from patient's care provider requesting a call back from a nurse in regards to patient's lab results please.

## 2020-09-01 ENCOUNTER — Ambulatory Visit (INDEPENDENT_AMBULATORY_CARE_PROVIDER_SITE_OTHER): Payer: PPO | Admitting: Physician Assistant

## 2020-09-01 ENCOUNTER — Encounter: Payer: Self-pay | Admitting: Physician Assistant

## 2020-09-01 ENCOUNTER — Other Ambulatory Visit: Payer: Self-pay

## 2020-09-01 DIAGNOSIS — F411 Generalized anxiety disorder: Secondary | ICD-10-CM | POA: Diagnosis not present

## 2020-09-01 DIAGNOSIS — G4700 Insomnia, unspecified: Secondary | ICD-10-CM

## 2020-09-01 DIAGNOSIS — F4310 Post-traumatic stress disorder, unspecified: Secondary | ICD-10-CM | POA: Diagnosis not present

## 2020-09-01 DIAGNOSIS — F3341 Major depressive disorder, recurrent, in partial remission: Secondary | ICD-10-CM

## 2020-09-01 DIAGNOSIS — F429 Obsessive-compulsive disorder, unspecified: Secondary | ICD-10-CM

## 2020-09-01 MED ORDER — SERTRALINE HCL 100 MG PO TABS: 100.0000 mg | ORAL_TABLET | Freq: Two times a day (BID) | ORAL | 5 refills | Status: DC

## 2020-09-01 MED ORDER — CARBAMAZEPINE ER 300 MG PO CP12
300.0000 mg | ORAL_CAPSULE | Freq: Two times a day (BID) | ORAL | 5 refills | Status: AC
Start: 2020-09-01 — End: ?

## 2020-09-01 MED ORDER — DIAZEPAM 5 MG PO TABS: ORAL_TABLET | ORAL | 5 refills | Status: AC

## 2020-09-01 MED ORDER — TRAZODONE HCL 100 MG PO TABS
100.0000 mg | ORAL_TABLET | Freq: Every evening | ORAL | 5 refills | Status: AC | PRN
Start: 2020-09-01 — End: ?

## 2020-09-01 NOTE — Progress Notes (Signed)
[facility] Med Check  Patient ID: Randy Richards,  MRN: [id number]  PCP: Imagene Riches, NP  Date of Evaluation: 09/03/2020 Time spent:40 minutes  Chief Complaint:  Chief Complaint    Anxiety; Depression; Insomnia; Follow-up      HISTORY/CURRENT STATUS: HPI for routine med check.  Accompanied by Lavonna Monarch, Care Giver.  Andru has chronic pancreatitis and has been in quite a bit of pain recently.  He is being given tramadol when needed but his care team tries to hold off on giving that to him because it causes constipation really bad.  Sometimes Tylenol or Advil do not help the pain and the tramadol is a must.  He still lives alone, with one of his caregivers living across the street, and daily care with someone always there.  He is still working, he is able to enjoy things sometimes but is still grieving the loss of his father from a couple of years ago.  Energy and motivation are fair.  Says work is going okay but "like any other job I guess."  He does not cry easily.  Does not isolate.  Appetite is normal but he has lost weight apparently from the pancreatitis.  No suicidal or homicidal thoughts.  He still has quite a bit of anxiety and needs the Valium every day.  There have been a few times that he needed a third dose during the day but usually it is twice a day.  He still has a lot of obsessive thoughts and some compulsions but way better than it was before treatment several years ago.  He does not wash close as often as he did.  He does chain smoke, as many cigarettes as he can get his hands on he says.  His caregivers are in charge of counting he can have.  Patient denies increased energy with decreased need for sleep, no increased talkativeness, no racing thoughts, no impulsivity or risky behaviors, no increased spending, no increased libido, no grandiosity, no increased irritability or anger, and no hallucinations.  Denies dizziness, syncope, seizures, numbness,  tingling, tremor, tics, unsteady gait, slurred speech, confusion. Denies muscle or joint pain, stiffness, or dystonia.  Individual Medical History/ Review of Systems: Changes? :Yes    ER visit on 07/08/2020 for possible fall, was unwitnessed so unsure if it was due to syncope or seizure disorder.  There were no acute findings of CT of head or neck.  Did not require hospital admission. Chronic pancreatitis.    Past medications for mental health diagnoses include: Trazodone, BuSpar, hydroxyzine, carbamazepine for seizures, Keppra for seizures, Zoloft, Valium  Allergies: Sulfa antibiotics  Current Medications:  Current Outpatient Medications:  .  acetaminophen (TYLENOL) 500 MG tablet, Take 1,000 mg by mouth daily as needed for mild pain. , Disp: , Rfl:  .  aspirin 81 MG chewable tablet, Chew 81 mg by mouth daily. , Disp: , Rfl:  .  busPIRone (BUSPAR) 10 MG tablet, TAKE 1 TABLET BY MOUTH 2 TIMES DAILY, Disp: 60 tablet, Rfl: 5 .  Cyanocobalamin (VITAMIN B 12 PO), Take 1 tablet by mouth daily. , Disp: , Rfl:  .  dapagliflozin propanediol (FARXIGA) 10 MG TABS tablet, Take 10 mg by mouth daily., Disp: , Rfl:  .  fenofibrate 160 MG tablet, Take 160 mg by mouth daily., Disp: , Rfl:  .  levETIRAcetam (KEPPRA) 750 MG tablet, Take 750 mg by mouth 2 (two) times daily., Disp: , Rfl:  .  Omega-3 Fatty Acids (FISH OIL) 1000 MG  CAPS, Take 1,000 mg by mouth daily. , Disp: , Rfl:  .  omeprazole (PRILOSEC) 40 MG capsule, Take 1 capsule (40 mg total) by mouth daily., Disp: 30 capsule, Rfl: 3 .  ondansetron (ZOFRAN) 4 MG tablet, Take 4 mg by mouth as needed for nausea or vomiting., Disp: , Rfl:  .  Pancrelipase, Lip-Prot-Amyl, (ZENPEP) 40000-126000 units CPEP, TAKE 2 CAPSULES BY MOUTH WITH EACH MEAL AND TAKE 1 CAPSUL WITH A SNACK, Disp: 360 capsule, Rfl: 3 .  polyethylene glycol (MIRALAX) packet, Take 17 g by mouth daily. (Patient taking differently: Take 17 g by mouth daily as needed for mild constipation.), Disp:  14 each, Rfl: 0 .  Prenatal Vit-Fe Fumarate-FA (PRENATAL MULTIVITAMIN) TABS tablet, Take 1 tablet by mouth daily at 12 noon., Disp: , Rfl:  .  promethazine (PHENERGAN) 25 MG tablet, Take 1 tablet (25 mg total) by mouth every 6 (six) hours as needed for nausea or vomiting., Disp: 10 tablet, Rfl: 0 .  simvastatin (ZOCOR) 40 MG tablet, Take 40 mg by mouth every evening., Disp: , Rfl:  .  traMADol (ULTRAM) 50 MG tablet, Take 50 mg by mouth every 6 (six) hours as needed., Disp: , Rfl:  .  carbamazepine (CARBATROL) 300 MG 12 hr capsule, Take 1 capsule (300 mg total) by mouth 2 (two) times daily., Disp: 60 capsule, Rfl: 5 .  diazepam (VALIUM) 5 MG tablet, 1 po bid prn and may repeat 1 mid-day occasionally, prn, Disp: 75 tablet, Rfl: 5 .  sertraline (ZOLOFT) 100 MG tablet, Take 1 tablet (100 mg total) by mouth in the morning and at bedtime., Disp: 60 tablet, Rfl: 5 .  traZODone (DESYREL) 100 MG tablet, Take 1-2 tablets (100-200 mg total) by mouth at bedtime as needed., Disp: 60 tablet, Rfl: 5 Medication Side Effects: none  Family Medical/ Social History: Changes?  No  MENTAL HEALTH EXAM:  There were no vitals taken for this visit.There is no height or weight on file to calculate BMI.  General Appearance: Casual, Neat, Well Groomed and And appears much thinner than the last visit 6 months ago.   Eye Contact:  Good  Speech:  Clear and Coherent and Slow  Volume:  Normal  Mood:  Euthymic  Affect:  Appropriate  Thought Process:  Goal Directed and Descriptions of Associations: Intact  Orientation:  Full (Time, Place, and Person)  Thought Content: Logical   Suicidal Thoughts:  No  Homicidal Thoughts:  No  Memory:  Immediate;   Fair Recent;   Fair Remote;   Fair  Judgement:  Good  Insight:  Good  Psychomotor Activity:  Normal   Concentration:  Concentration: Good  Recall:  Good  Fund of Knowledge: Good  Language: Good  Assets:  Desire for Improvement  ADL's:  Intact  Cognition: WNL and  Impaired,  Mild (his baseline)  Prognosis:  Good   Labs: 07/08/2020 CBC showed normal white blood cell count, hemoglobin 13.4, hematocrit 39.6, normal platelets at 423. CMP all normal except glucose 132, specifically normal LFTs Carbamazepine level was 2.8   DIAGNOSES:    ICD-10-CM   1. PTSD (post-traumatic stress disorder)  F43.10   2. Obsessive-compulsive disorder, unspecified type  F42.9   3. Recurrent major depressive disorder, in partial remission ([place])  F33.41   4. Insomnia, unspecified type  G47.00   5. Generalized anxiety disorder  F41.1     Receiving Psychotherapy: No    RECOMMENDATIONS:  PDMP was reviewed. I provided 40 minutes of face-to-face time during this  encounter, including time spent before and after the visit in review of his chart.  I went over the lab results as far as they are importance to mental health medications. It seems the OCD is getting worse again, I recommend increasing the Zoloft. We discussed that his carbamazepine level was low but it does not necessarily mean we should increase the dose.  With his history I think we can leave the dose the same and focus on the OCD. Increase Zoloft 100 mg to 1 p.o. twice daily.  (They prefer to dose it twice daily even though they are aware they will have to.) Continue BuSpar 10 mg, 1 p.o. twice daily. Continue carbamazepine 300 mg, 1 p.o. twice daily. Continue Valium 5 mg, 1 p.o. twice daily as needed and may repeat 1 p.o. midday as needed on occasion. Continue trazodone 100 mg, 1-2 p.o. nightly as needed. Return in 2 months.  Donnal Moat, PA-C

## 2020-09-04 ENCOUNTER — Other Ambulatory Visit: Payer: Self-pay | Admitting: Gastroenterology

## 2020-09-04 ENCOUNTER — Other Ambulatory Visit: Payer: Self-pay

## 2020-09-04 ENCOUNTER — Ambulatory Visit (HOSPITAL_COMMUNITY)
Admission: RE | Admit: 2020-09-04 | Discharge: 2020-09-04 | Disposition: A | Discharge status: Home / Self Care | Payer: PPO | Source: Ambulatory Visit | Attending: Gastroenterology | Admitting: Gastroenterology

## 2020-09-04 DIAGNOSIS — R634 Abnormal weight loss: Secondary | ICD-10-CM

## 2020-09-04 DIAGNOSIS — K861 Other chronic pancreatitis: Secondary | ICD-10-CM

## 2020-09-04 DIAGNOSIS — R197 Diarrhea, unspecified: Secondary | ICD-10-CM | POA: Insufficient documentation

## 2020-09-04 DIAGNOSIS — K8020 Calculus of gallbladder without cholecystitis without obstruction: Secondary | ICD-10-CM | POA: Diagnosis not present

## 2020-09-04 IMAGING — MR MR ABDOMEN WO/W CM MRCP
17 of 20 series · 45 of 48 positions shown · IV contrast (gadavist)
Comparison: [DATE]

CLINICAL DATA: Diarrhea, weight loss and persistent chronic
pancreatitis.

EXAM:
MRI ABDOMEN WITHOUT AND WITH CONTRAST (INCLUDING MRCP)
TECHNIQUE: Multiplanar multisequence MR imaging of the abdomen was performed
both before and after the administration of intravenous contrast.
Heavily T2-weighted images of the biliary and pancreatic ducts were
obtained, and three-dimensional MRCP images were rendered by post
processing.
CONTRAST:  6mL GADAVIST GADOBUTROL 1 MMOL/ML IV SOLN

[Series 4: T2 fat-sat · axial · 6.0mm · 1.25mm/px · 1 of 42 slices shown]
[im 1/42]
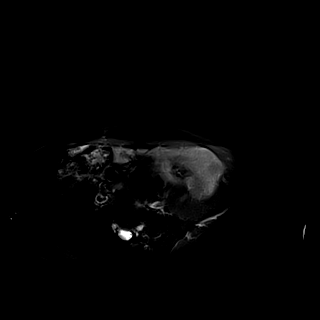

[Series 6: cor_3d_spc_trig · coronal · 1.0mm · 0.59mm/px · 2 of 72 slices shown]
[im 1/72]
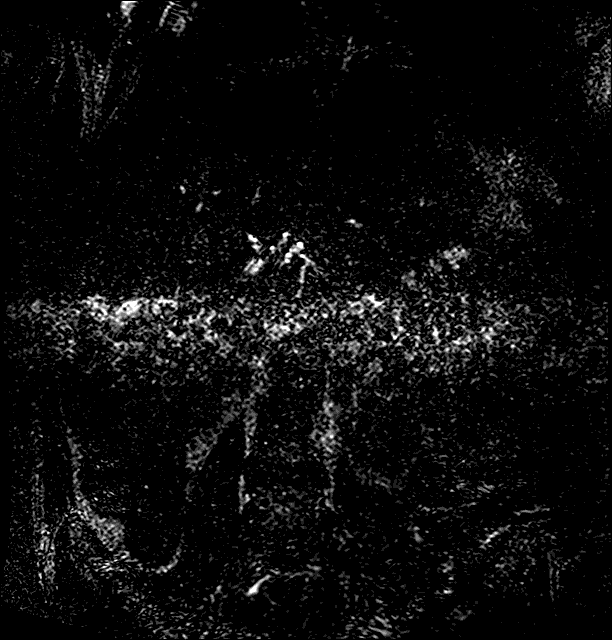
[im 72/72]
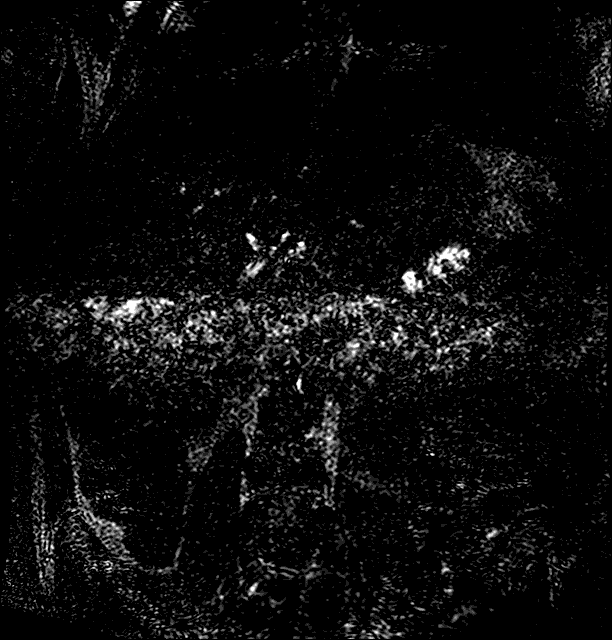

[Series 8: DWI · axial · 6.0mm · 1.49mm/px · z∈[-229,+35]mm · 2 of 76 slices shown (1 of 2)]
[im 1/76]
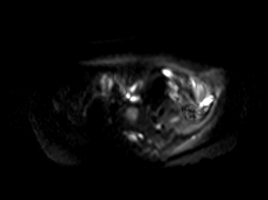
[im 76/76]
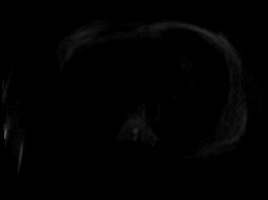

[Series 9: DWI · axial · 6.0mm · 1.49mm/px · 1 of 38 slices shown (2 of 2)]
[im 1/38]
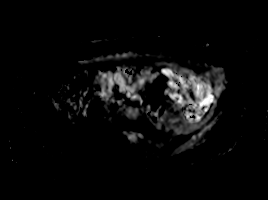

[Series 11: T2 · coronal · 6.0mm · 1.48mm/px · 1 of 30 slices shown (1 of 2)]
[im 1/30]
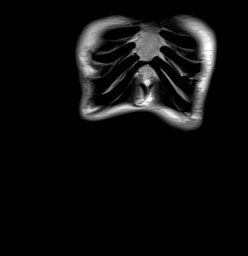

[Series 12: cor obl thk · sagittal · 50.0mm · 0.78mm/px · 1 of 9 slices shown]
[im 1/9]
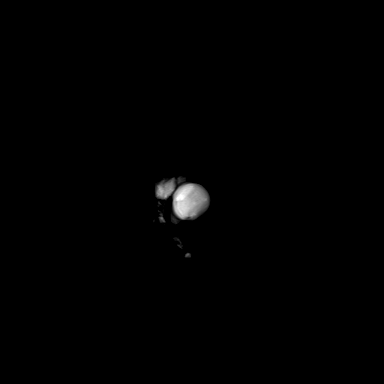

[Series 13: T2 · axial · 6.0mm · 1.56mm/px · z∈[-249,+57]mm · 2 of 44 slices shown (2 of 2)]
[im 1/44]
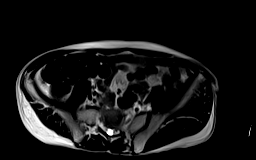
[im 44/44]
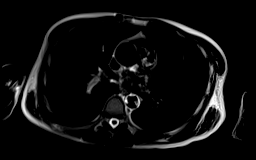

[Series 15: T1 dynamic · axial · 3.0mm · 1.25mm/px · z∈[-238,+47]mm · 4 of 96 slices shown (1 of 6)]
[im 1/96]
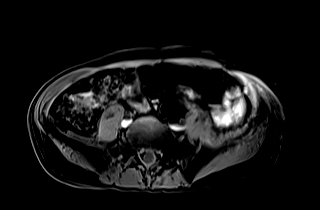
[im 32/96]
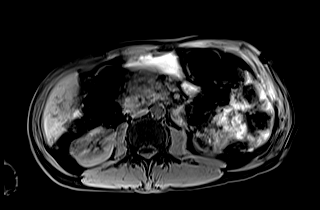
[im 64/96]
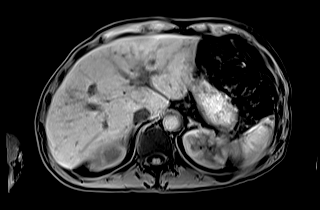
[im 96/96]
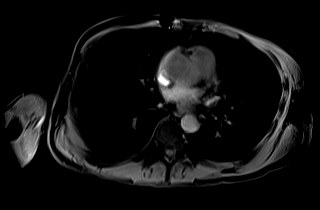

[Series 18: T1 dynamic · axial · 3.0mm · 1.25mm/px · z∈[-238,+47]mm · 4 of 96 slices shown (2 of 6)]
[im 1/96]
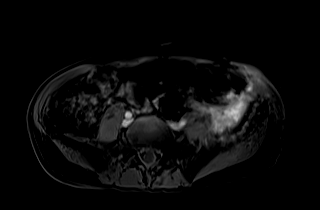
[im 32/96]
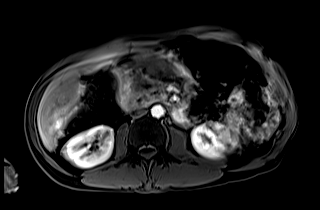
[im 64/96]
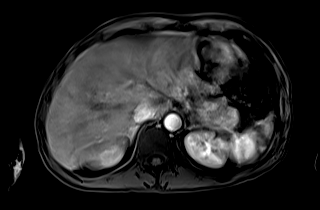
[im 96/96]
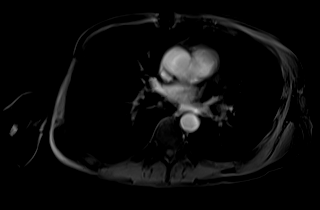

[Series 20: T1 dynamic · axial · 3.0mm · 1.25mm/px · z∈[-238,+47]mm · 4 of 96 slices shown (3 of 6)]
[im 1/96]
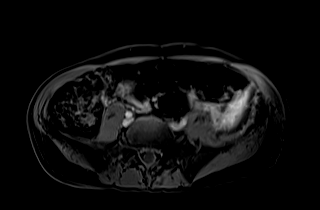
[im 32/96]
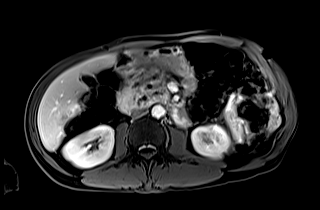
[im 64/96]
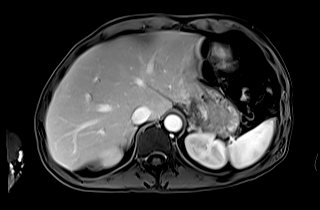
[im 96/96]
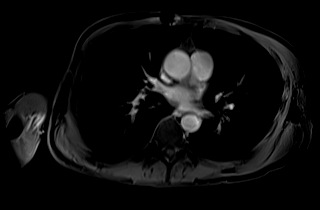

[Series 22: T1 dynamic · axial · 3.0mm · 1.25mm/px · z∈[-238,+47]mm · 4 of 96 slices shown (4 of 6)]
[im 1/96]
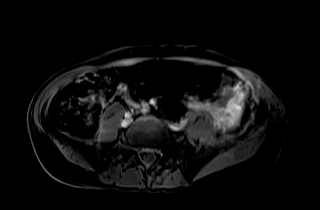
[im 32/96]
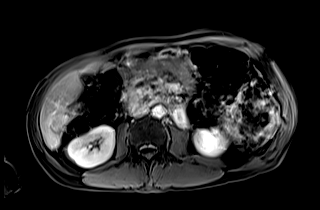
[im 64/96]
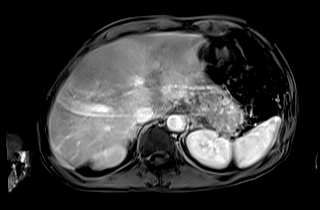
[im 96/96]
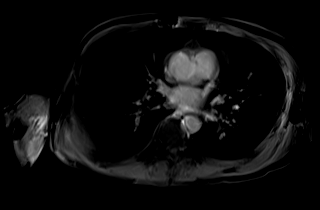

[Series 24: T1 dynamic · coronal · 5.0mm · 1.41mm/px · 2 of 48 slices shown (5 of 6)]
[im 1/48]
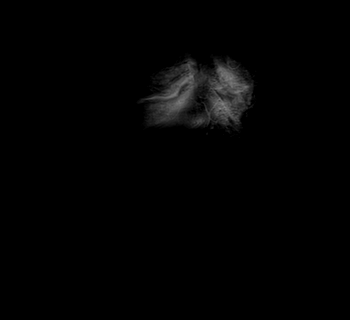
[im 48/48]
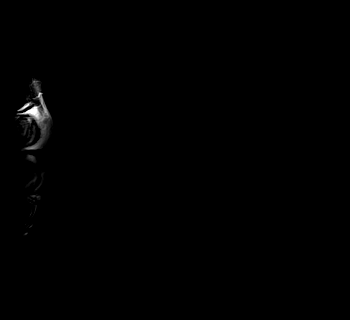

[Series 27: T1 dynamic · axial · 3.0mm · 1.25mm/px · z∈[-238,+47]mm · 4 of 96 slices shown (6 of 6)]
[im 1/96]
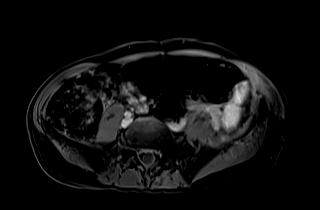
[im 32/96]
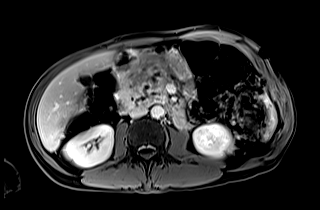
[im 64/96]
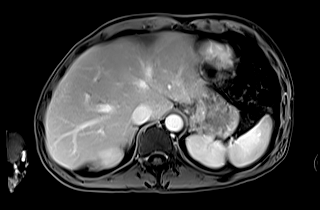
[im 96/96]
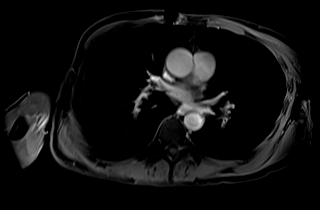

[Series 102: sub_20 sec · axial · 3.0mm · 1.25mm/px · z∈[-238,+47]mm · 4 of 96 slices shown]
[im 1/96]
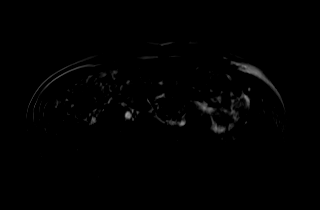
[im 32/96]
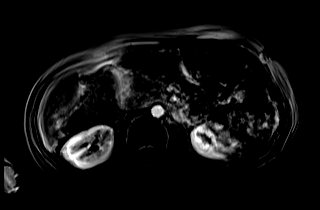
[im 64/96]
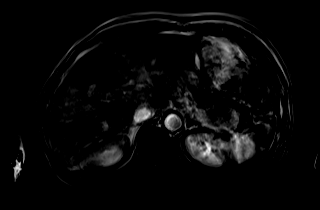
[im 96/96]
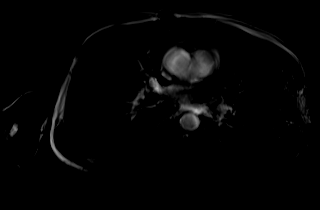

[Series 103: sub_45 sec · axial · 3.0mm · 1.25mm/px · z∈[-238,+47]mm · 4 of 96 slices shown]
[im 1/96]
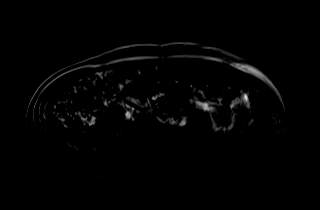
[im 32/96]
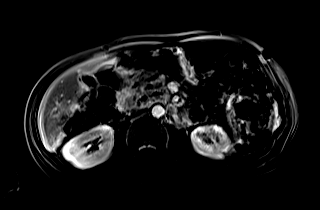
[im 64/96]
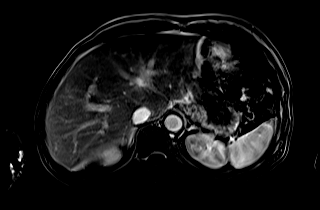
[im 96/96]
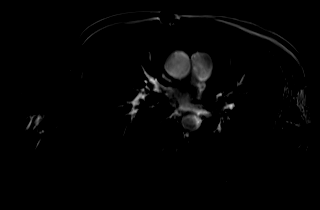

[Series 104: sub_90 sec · axial · 3.0mm · 1.25mm/px · z∈[-100,+47]mm · 2 of 50 slices shown]
[im 1/50]
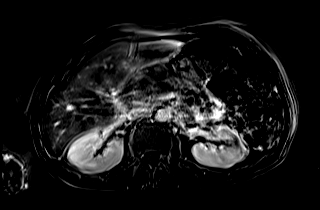
[im 50/50]
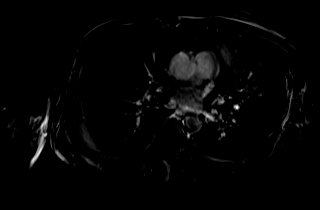

[Series 105: sub_delay · axial · 3.0mm · 1.25mm/px · z∈[-202,+47]mm · 3 of 84 slices shown]
[im 1/84]
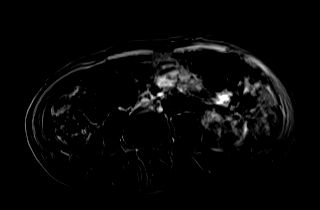
[im 42/84]
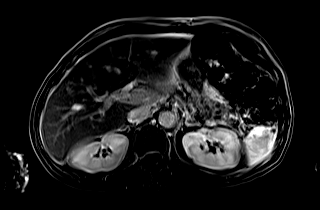
[im 84/84]
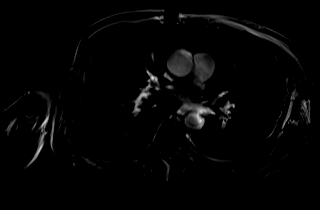

[45 of 48 positions shown; findings below may reference images not displayed]

FINDINGS: Lower chest: No acute findings.

Hepatobiliary: Tiny cysts noted within lateral segment of left
hepatic lobe. No suspicious liver lesion. Partially decompressed
gallbladder contains a 9 mm stone within the neck.

No signs of gallbladder wall inflammation. No common bile duct
dilatation.

Pancreas: Extensive changes of chronic pancreatitis again noted.
Mild diffuse peripancreatic edema is noted. Progressive main duct
dilatation is identified. On today's exam at the level of the
pancreatic body the main duct measures 8 mm, image [DATE]. This is
compared with 6 mm previously. Large cystic structures are
identified within the head of pancreas. The largest measures 4.7 cm,
image [DATE]. Previously this measured 1.6 cm. Adjacent cystic
structure measures 2.5 cm, image [DATE]. This is compared with 0.7 cm
previously. Within the tail of pancreas there are numerous
progressive cystic structures identified. No free fluid or extra
pancreatic fluid collections identified.

Spleen:  Within normal limits in size and appearance.

Adrenals/Urinary Tract: No masses identified. No evidence of
hydronephrosis.

Stomach/Bowel: Visualized portions within the abdomen are
unremarkable.

Vascular/Lymphatic: Aortic atherosclerosis. No aneurysm. No
abdominopelvic adenopathy.

Other:  None.

Musculoskeletal: No suspicious bone lesions identified.
IMPRESSION: 1. Changes of acute on chronic pancreatitis noted. Progressive main
duct dilatation is identified which now measures up to 8 mm.
Interval increase in size of multiple cystic structures throughout
the pancreas the largest of these is in the head of pancreas
measuring 4.7 cm. Recommend follow-up imaging in 3 months with
repeat MR of the abdomen to ensure regression of these cystic foci
and to exclude underlying neoplastic process.
2. 9 mm stone within the neck of gallbladder. No signs of
gallbladder wall inflammation.

## 2020-09-04 MED ORDER — GADOBUTROL 1 MMOL/ML IV SOLN
6.0000 mL | Freq: Once | INTRAVENOUS | Status: AC | PRN
  Administered 2020-09-04: 6 mL via INTRAVENOUS

## 2020-09-05 NOTE — Progress Notes (Signed)
Please inform the patient's caregiver. MRCP -Changes of acute on chronic pancreatitis. -Progressive MPD dilatation.  No definite stones in PD -Multiple cysts s/o pseudocysts.  Largest 4.7 cm in HOP. -9 mm stone in the neck of the gallbladder. No CBD dilatation.  [name], Can we follow-up on the labs.  Should have been done at home health care facility including CBC, CMP, lipase, CA 19-9 and CEA.  Also stool for fecal elastase. Also put him on for repeat MRCP in 3 months.  We will get another opinion from Dr. Rush Landmark.    [name], Can I ask you for your opinion regarding this pt. Coralyn Pear is 50 year old with traumatic brain injury (lives in a group home) With failure to thrive, weight loss, intermittent diarrhea.  No abdominal pain. Has H/O chronic pancreatitis d/t alcohol. No ETOH over last several years.  Unfortunately, continues to smoke.  We had MRCP done as above.  Would EUS/ERCP help?

## 2020-09-11 NOTE — Telephone Encounter (Signed)
Spoke to patient's care giver [name] to inform her that Dr Lyndel Safe has reviewed his labs. No new orders at this time [name] voiced understanding.

## 2020-09-11 NOTE — Telephone Encounter (Signed)
Patients care giver Randy Richards calling to follow up on lab results and MRI

## 2020-09-14 DIAGNOSIS — K8080 Other cholelithiasis without obstruction: Secondary | ICD-10-CM | POA: Diagnosis not present

## 2020-09-14 DIAGNOSIS — E1165 Type 2 diabetes mellitus with hyperglycemia: Secondary | ICD-10-CM | POA: Diagnosis not present

## 2020-09-14 DIAGNOSIS — K861 Other chronic pancreatitis: Secondary | ICD-10-CM | POA: Diagnosis not present

## 2020-10-06 ENCOUNTER — Other Ambulatory Visit: Payer: Self-pay

## 2020-10-06 ENCOUNTER — Telehealth: Payer: Self-pay

## 2020-10-06 MED ORDER — PANCRELIPASE (LIP-PROT-AMYL) 36000-114000 UNITS PO CPEP: ORAL_CAPSULE | ORAL | 11 refills | Status: DC

## 2020-10-06 NOTE — Telephone Encounter (Signed)
Spoke with Mr Randy Richards and Ms. Petty. Patient at first seem to be doing fine with the Zenpep 40000U initially but started to get worse with the diarrhea, vomiting, sugar levels were getting out of control, energy started to diminish and he even dropped to 128lbs. Since then they have been tapering his medication to now 1 Zenpep with breakfast and 1 Zenpep at dinner and was told that he is doing so much better. He has gained 9 pounds, more energized, glucose is better and he seem to be doing a lot better in that he is havig normal BM's. They said that the Zenpep cost is 1200 monthly and they didn't qualify for the patient assistance program either. I told them that I had sent the Creon 72000U to take with each meal and 36000 to take with snack. They are wanting to know what are the recommendations because they thought he should be of this medication Please advise

## 2020-10-06 NOTE — Telephone Encounter (Signed)
-----   Message from Jackquline Denmark, MD sent at 10/05/2020  9:11 PM EDT ----- [name] make sure he takes Zenpep or Creon 72,000U lipase (can use 2 of 36,000) with each meal and 36,000U lipase with snacks. I am sure we have some samples as well. RG    [name], If you could see him or set him up with EUS/ERCP, that will be great. RG    ----- Message ----- From: Irving Copas., MD Sent: 09/16/2020  12:44 PM EDT To: Jackquline Denmark, MD, Angie Fava, LPN  RG, Difficult situation for sure. If he is having progressive weight loss, we certainly may need to think about the role of putting him through an ERCP if he has evidence of pancreatic enzyme deficiency which I suspect he well.  May be worthwhile to getting him on PERT therapy 72,000 units with each meal and 36,000 units with each snack to begin the process.  I be happy to meet with him and his caregiver to go over things and decide upon ERCP if you like but certainly think that we need to have him on PERT therapy and see how things go.  Let me know what you all decide. Thanks. GM ----- Message ----- From: Jackquline Denmark, MD Sent: 09/05/2020   7:06 PM EST To: Irving Copas., MD, #  Please inform the patient's caregiver. MRCP -Changes of acute on chronic pancreatitis. -Progressive MPD dilatation.  No definite stones in PD -Multiple cysts s/o pseudocysts.  Largest 4.7 cm in HOP. -9 mm stone in the neck of the gallbladder. No CBD dilatation.  [name], Can we follow-up on the labs.  Should have been done at home health care facility including CBC, CMP, lipase, CA 19-9 and CEA.  Also stool for fecal elastase. Also put him on for repeat MRCP in 3 months.  We will get another opinion from Dr. Rush Landmark.    [name], Can I ask you for your opinion regarding this pt. Randy Richards is 50 year old with traumatic brain injury (lives in a group home) With failure to thrive, weight loss, intermittent diarrhea.  No abdominal pain. Has H/O  chronic pancreatitis d/t alcohol. No ETOH over last several years.  Unfortunately, continues to smoke.  We had MRCP done as above.  Would EUS/ERCP help?

## 2020-10-10 NOTE — Telephone Encounter (Signed)
Please continue with the medications EUS/ERCP with Dr. Rush Landmark as recommended by him RG

## 2020-10-12 NOTE — Telephone Encounter (Signed)
Scheduled for May 17 at 1130 with Dr Rush Landmark to discuss EUS/ERCP

## 2020-11-02 ENCOUNTER — Ambulatory Visit (INDEPENDENT_AMBULATORY_CARE_PROVIDER_SITE_OTHER): Payer: PPO | Admitting: Physician Assistant

## 2020-11-02 ENCOUNTER — Encounter: Payer: Self-pay | Admitting: Physician Assistant

## 2020-11-02 ENCOUNTER — Other Ambulatory Visit: Payer: Self-pay

## 2020-11-02 DIAGNOSIS — F429 Obsessive-compulsive disorder, unspecified: Secondary | ICD-10-CM

## 2020-11-02 DIAGNOSIS — F3341 Major depressive disorder, recurrent, in partial remission: Secondary | ICD-10-CM

## 2020-11-02 DIAGNOSIS — G4700 Insomnia, unspecified: Secondary | ICD-10-CM | POA: Diagnosis not present

## 2020-11-02 DIAGNOSIS — F4310 Post-traumatic stress disorder, unspecified: Secondary | ICD-10-CM | POA: Diagnosis not present

## 2020-11-02 DIAGNOSIS — F411 Generalized anxiety disorder: Secondary | ICD-10-CM

## 2020-11-02 MED ORDER — BUSPIRONE HCL 10 MG PO TABS: 10.0000 mg | ORAL_TABLET | Freq: Two times a day (BID) | ORAL | 5 refills | Status: AC

## 2020-11-02 NOTE — Progress Notes (Signed)
[facility]  Patient ID: Randy Richards,  MRN: [id number]  PCP: Imagene Riches, NP  Date of Evaluation: 11/02/2020 Time spent:20 minutes  Chief Complaint:  Chief Complaint    Anxiety; Depression; Insomnia; Follow-up      HISTORY/CURRENT STATUS: HPI For 2 month f/u.  Accompanied by Lavonna Monarch, Care Giver.  At [place] we increased Zoloft.  He has been doing better as far as any OCD symptoms.  He has not been as anxious either.  Still needs the Valium but not quite as much.  Not as agitated.  He still washes clothes but not obsessively.  They do have the washing machine locked so that he cannot get up during the night and wash close.  He misses his dad who died several years ago.  "I think something would be wrong if I did not."  He continues to have caregivers with him during the day.  Horris Latino states he is doing well with ADLs.  He is able to enjoy things when he can, particularly with car racing memorabilia and the like.  He continues to smoke.  Has chronic pancreatitis which causes quite a bit of pain at times.  No suicidal or homicidal thoughts.  Patient denies increased energy with decreased need for sleep, no increased talkativeness, no racing thoughts, no impulsivity or risky behaviors, no increased spending, no increased libido, no grandiosity, no increased irritability or anger, and no hallucinations.  Denies dizziness, syncope, seizures, numbness, tingling, tremor, tics, unsteady gait, slurred speech, confusion. Denies muscle or joint pain, stiffness, or dystonia.  Individual Medical History/ Review of Systems: Changes? :No   Past medications for mental health diagnoses include: Trazodone, BuSpar, hydroxyzine, carbamazepine for seizures, Keppra for seizures, Zoloft, Valium  Allergies: Sulfa antibiotics  Current Medications:  Current Outpatient Medications:  .  acetaminophen (TYLENOL) 500 MG tablet, Take 1,000 mg by mouth daily as needed for mild pain. , Disp: ,  Rfl:  .  aspirin 81 MG chewable tablet, Chew 81 mg by mouth daily. , Disp: , Rfl:  .  carbamazepine (CARBATROL) 300 MG 12 hr capsule, Take 1 capsule (300 mg total) by mouth 2 (two) times daily., Disp: 60 capsule, Rfl: 5 .  Cyanocobalamin (VITAMIN B 12 PO), Take 1 tablet by mouth daily. , Disp: , Rfl:  .  dapagliflozin propanediol (FARXIGA) 10 MG TABS tablet, Take 10 mg by mouth daily., Disp: , Rfl:  .  diazepam (VALIUM) 5 MG tablet, 1 po bid prn and may repeat 1 mid-day occasionally, prn, Disp: 75 tablet, Rfl: 5 .  fenofibrate 160 MG tablet, Take 160 mg by mouth daily., Disp: , Rfl:  .  levETIRAcetam (KEPPRA) 750 MG tablet, Take 750 mg by mouth 2 (two) times daily., Disp: , Rfl:  .  lipase/protease/amylase (CREON) 36000 UNITS CPEP capsule, Take 2 capsules (72,000 Units total) by mouth 3 (three) times daily with meals. May also take 1 capsule (36,000 Units total) as needed (with snacks)., Disp: 240 capsule, Rfl: 11 .  omeprazole (PRILOSEC) 40 MG capsule, Take 1 capsule (40 mg total) by mouth daily., Disp: 30 capsule, Rfl: 3 .  ondansetron (ZOFRAN) 4 MG tablet, Take 4 mg by mouth as needed for nausea or vomiting., Disp: , Rfl:  .  polyethylene glycol (MIRALAX) packet, Take 17 g by mouth daily. (Patient taking differently: Take 17 g by mouth daily as needed for mild constipation.), Disp: 14 each, Rfl: 0 .  Prenatal Vit-Fe Fumarate-FA (PRENATAL MULTIVITAMIN) TABS tablet, Take 1 tablet by mouth  daily at 12 noon., Disp: , Rfl:  .  promethazine (PHENERGAN) 25 MG tablet, Take 1 tablet (25 mg total) by mouth every 6 (six) hours as needed for nausea or vomiting., Disp: 10 tablet, Rfl: 0 .  sertraline (ZOLOFT) 100 MG tablet, Take 1 tablet (100 mg total) by mouth in the morning and at bedtime., Disp: 60 tablet, Rfl: 5 .  simvastatin (ZOCOR) 40 MG tablet, Take 40 mg by mouth every evening., Disp: , Rfl:  .  traMADol (ULTRAM) 50 MG tablet, Take 50 mg by mouth every 6 (six) hours as needed., Disp: , Rfl:  .   traZODone (DESYREL) 100 MG tablet, Take 1-2 tablets (100-200 mg total) by mouth at bedtime as needed., Disp: 60 tablet, Rfl: 5 .  busPIRone (BUSPAR) 10 MG tablet, Take 1 tablet (10 mg total) by mouth 2 (two) times daily., Disp: 60 tablet, Rfl: 5 .  Omega-3 Fatty Acids (FISH OIL) 1000 MG CAPS, Take 1,000 mg by mouth daily.  (Patient not taking: Reported on 11/02/2020), Disp: , Rfl:  Medication Side Effects: none  Family Medical/ Social History: Changes? No   MENTAL HEALTH EXAM:  There were no vitals taken for this visit.There is no height or weight on file to calculate BMI.  General Appearance: Casual, Fairly Groomed and cachectic  Eye Contact:  Fair  Speech:  Clear and Coherent and Normal Rate  Volume:  Normal  Mood:  Anxious  Affect:  Anxious  Thought Process:  Goal Directed and Descriptions of Associations: Circumstantial  Orientation:  Full (Time, Place, and Person)  Thought Content: Obsessions and Rumination   Suicidal Thoughts:  No  Homicidal Thoughts:  No  Memory:  Immediate;   Fair Recent;   Fair  Judgement:  Fair  Insight:  Fair  Psychomotor Activity:  Normal and limps  Concentration:  Concentration: Fair and Attention Span: Fair  Recall:  Good  Fund of Knowledge: Good  Language: Good  Assets:  Desire for Improvement  ADL's:  Intact  Cognition: Impaired,  Moderate  Prognosis:  Fair    DIAGNOSES:    ICD-10-CM   1. PTSD (post-traumatic stress disorder)  F43.10   2. Obsessive-compulsive disorder, unspecified type  F42.9   3. Recurrent major depressive disorder, in partial remission ([place])  F33.41   4. Generalized anxiety disorder  F41.1   5. Insomnia, unspecified type  G47.00     Receiving Psychotherapy: No    RECOMMENDATIONS:  PDMP reviewed.  I provided 20 minutes of face to face time during this encounter including time spent before and after the appt in records review and charting. He is doing fairly well as far as his psych meds go, so no changes are  recommended. Continue Buspar 10 mg, 1 po bid. Cont Carbamazepine 300 mg, 1 po bid. Continue Valium 5 mg, 1 po bid prn w/ occas extra 1/2 pill mid-day prn. Continue Zoloft 100 mg, 1 po bid. Continue Trazodone 100 mg, 1-2 qhs prn.  Return in 3 mo.  Donnal Moat, PA-C

## 2020-11-21 ENCOUNTER — Encounter: Payer: Self-pay | Admitting: Gastroenterology

## 2020-11-21 ENCOUNTER — Ambulatory Visit (INDEPENDENT_AMBULATORY_CARE_PROVIDER_SITE_OTHER): Payer: PPO | Admitting: Gastroenterology

## 2020-11-21 ENCOUNTER — Other Ambulatory Visit (INDEPENDENT_AMBULATORY_CARE_PROVIDER_SITE_OTHER): Payer: PPO

## 2020-11-21 VITALS — BP 100/60 | HR 64 | Ht 69.0 in | Wt 149.0 lb

## 2020-11-21 DIAGNOSIS — R197 Diarrhea, unspecified: Secondary | ICD-10-CM | POA: Diagnosis not present

## 2020-11-21 DIAGNOSIS — K8689 Other specified diseases of pancreas: Secondary | ICD-10-CM

## 2020-11-21 DIAGNOSIS — R935 Abnormal findings on diagnostic imaging of other abdominal regions, including retroperitoneum: Secondary | ICD-10-CM

## 2020-11-21 DIAGNOSIS — K861 Other chronic pancreatitis: Secondary | ICD-10-CM

## 2020-11-21 DIAGNOSIS — K529 Noninfective gastroenteritis and colitis, unspecified: Secondary | ICD-10-CM

## 2020-11-21 DIAGNOSIS — R978 Other abnormal tumor markers: Secondary | ICD-10-CM

## 2020-11-21 DIAGNOSIS — K860 Alcohol-induced chronic pancreatitis: Secondary | ICD-10-CM | POA: Diagnosis not present

## 2020-11-21 DIAGNOSIS — R634 Abnormal weight loss: Secondary | ICD-10-CM

## 2020-11-21 LAB — COMPREHENSIVE METABOLIC PANEL
ALT: 9 U/L (ref 0–53)
AST: 11 U/L (ref 0–37)
Albumin: 4.3 g/dL (ref 3.5–5.2)
Alkaline Phosphatase: 30 U/L — ABNORMAL LOW (ref 39–117)
BUN: 21 mg/dL (ref 6–23)
CO2: 31 mEq/L (ref 19–32)
Calcium: 9.2 mg/dL (ref 8.4–10.5)
Chloride: 107 mEq/L (ref 96–112)
Creatinine, Ser: 0.76 mg/dL (ref 0.40–1.50)
GFR: 104.68 mL/min (ref >60.00)
Glucose, Bld: 105 mg/dL — ABNORMAL HIGH (ref 70–99)
Potassium: 3.9 mEq/L (ref 3.5–5.1)
Sodium: 143 mEq/L (ref 135–145)
Total Bilirubin: 0.3 mg/dL (ref 0.2–1.2)
Total Protein: 6.6 g/dL (ref 6.0–8.3)

## 2020-11-21 LAB — CBC
HCT: 36.3 % — ABNORMAL LOW (ref 39.0–52.0)
Hemoglobin: 12.6 g/dL — ABNORMAL LOW (ref 13.0–17.0)
MCHC: 34.7 g/dL (ref 30.0–36.0)
MCV: 97.5 fl (ref 78.0–100.0)
Platelets: 363 10*3/uL (ref 150.0–400.0)
RBC: 3.72 Mil/uL — ABNORMAL LOW (ref 4.22–5.81)
RDW: 13.8 % (ref 11.5–15.5)
WBC: 8.2 10*3/uL (ref 4.0–10.5)

## 2020-11-21 LAB — AMYLASE: Amylase: 183 U/L — ABNORMAL HIGH (ref 27–131)

## 2020-11-21 LAB — LIPASE: Lipase: 435 U/L — ABNORMAL HIGH (ref 11.0–59.0)

## 2020-11-21 NOTE — Patient Instructions (Addendum)
Your provider has requested that you go to the basement level for lab work before leaving today. Press "B" on the elevator. The lab is located at the first door on the left as you exit the elevator.  Due to recent changes in healthcare laws, you may see the results of your imaging and laboratory studies on MyChart before your provider has had a chance to review them.  We understand that in some cases there may be results that are confusing or concerning to you. Not all laboratory results come back in the same time frame and the provider may be waiting for multiple results in order to interpret others.  Please give [place] 48 hours in order for your provider to thoroughly review all the results before contacting the office for clarification of your results.   You have been scheduled for an endoscopy. Please follow written instructions given to you at your visit today. If you use inhalers (even only as needed), please bring them with you on the day of your procedure.  Lab requisition has been given to you to take back to [facility] in the event that patient has an episode of abdomen pain.  Please have labs drawn and faxed to our office at [phone].    If you are age 51 or younger, your body mass index should be between 19-25. Your Body mass index is 22 kg/m. If this is out of the aformentioned range listed, please consider follow up with your Primary Care Provider.   Thank you for choosing me and [facility].  Dr. Rush Landmark

## 2020-11-22 ENCOUNTER — Encounter: Payer: Self-pay | Admitting: Gastroenterology

## 2020-11-22 DIAGNOSIS — R978 Other abnormal tumor markers: Secondary | ICD-10-CM | POA: Insufficient documentation

## 2020-11-22 DIAGNOSIS — K8689 Other specified diseases of pancreas: Secondary | ICD-10-CM | POA: Insufficient documentation

## 2020-11-22 DIAGNOSIS — R634 Abnormal weight loss: Secondary | ICD-10-CM | POA: Insufficient documentation

## 2020-11-22 DIAGNOSIS — K860 Alcohol-induced chronic pancreatitis: Secondary | ICD-10-CM | POA: Insufficient documentation

## 2020-11-22 DIAGNOSIS — R935 Abnormal findings on diagnostic imaging of other abdominal regions, including retroperitoneum: Secondary | ICD-10-CM | POA: Insufficient documentation

## 2020-11-22 DIAGNOSIS — R197 Diarrhea, unspecified: Secondary | ICD-10-CM | POA: Insufficient documentation

## 2020-11-22 LAB — CANCER ANTIGEN 19-9: CA 19-9: 16 U/mL (ref <34)
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
## 2020-12-15 ENCOUNTER — Other Ambulatory Visit: Payer: Self-pay

## 2020-12-15 ENCOUNTER — Encounter (HOSPITAL_COMMUNITY): Payer: Self-pay | Admitting: Gastroenterology

## 2020-12-15 NOTE — Progress Notes (Signed)
Attempted to obtain medical history via telephone, unable to reach at this time. Voicemail full, unable to leave message.

## 2020-12-18 NOTE — Anesthesia Preprocedure Evaluation (Addendum)
Anesthesia Evaluation  Patient identified by MRN, date of birth, ID band Patient awake    Reviewed: Allergy & Precautions, NPO status , Patient's Chart, lab work & pertinent test results  Airway Mallampati: II  TM Distance: >3 FB Neck ROM: Full    Dental no notable dental hx. (+) Edentulous Upper, Edentulous Lower   Pulmonary Current SmokerPatient did not abstain from smoking.,    Pulmonary exam normal breath sounds clear to auscultation       Cardiovascular Normal cardiovascular exam Rhythm:Regular Rate:Normal     Neuro/Psych  Headaches, Seizures -,  HX of TBI information from care giver    GI/Hepatic GERD  ,  Endo/Other  diabetes  Renal/GU      Musculoskeletal negative musculoskeletal ROS (+)   Abdominal   Peds  Hematology  (+) Blood dyscrasia, anemia ,   Anesthesia Other Findings   Reproductive/Obstetrics                            Anesthesia Physical Anesthesia Plan  ASA: 3  Anesthesia Plan: MAC   Post-op Pain Management:    Induction:   PONV Risk Score and Plan: Treatment may vary due to age or medical condition  Airway Management Planned: Natural Airway  Additional Equipment: None  Intra-op Plan:   Post-operative Plan:   Informed Consent: I have reviewed the patients History and Physical, chart, labs and discussed the procedure including the risks, benefits and alternatives for the proposed anesthesia with the patient or authorized representative who has indicated his/her understanding and acceptance.     Dental advisory given  Plan Discussed with: CRNA and Anesthesiologist  Anesthesia Plan Comments: (Upper EUS for chronic pancreatitis)       Anesthesia Quick Evaluation

## 2020-12-19 ENCOUNTER — Ambulatory Visit (HOSPITAL_COMMUNITY)
Admission: RE | Admit: 2020-12-19 | Discharge: 2020-12-19 | Disposition: A | Discharge status: Home / Self Care | Payer: PPO | Attending: Gastroenterology | Admitting: Gastroenterology

## 2020-12-19 ENCOUNTER — Encounter (HOSPITAL_COMMUNITY)
Admission: RE | Disposition: A | Discharge status: Home / Self Care | Payer: Self-pay | Source: Home / Self Care | Attending: Gastroenterology

## 2020-12-19 ENCOUNTER — Ambulatory Visit (HOSPITAL_COMMUNITY): Payer: PPO | Admitting: Anesthesiology

## 2020-12-19 ENCOUNTER — Other Ambulatory Visit: Payer: Self-pay

## 2020-12-19 ENCOUNTER — Encounter (HOSPITAL_COMMUNITY): Payer: Self-pay | Admitting: Gastroenterology

## 2020-12-19 DIAGNOSIS — K3189 Other diseases of stomach and duodenum: Secondary | ICD-10-CM | POA: Diagnosis not present

## 2020-12-19 DIAGNOSIS — Z87442 Personal history of urinary calculi: Secondary | ICD-10-CM | POA: Diagnosis not present

## 2020-12-19 DIAGNOSIS — Z7984 Long term (current) use of oral hypoglycemic drugs: Secondary | ICD-10-CM | POA: Insufficient documentation

## 2020-12-19 DIAGNOSIS — F4310 Post-traumatic stress disorder, unspecified: Secondary | ICD-10-CM | POA: Diagnosis not present

## 2020-12-19 DIAGNOSIS — K2970 Gastritis, unspecified, without bleeding: Secondary | ICD-10-CM | POA: Diagnosis not present

## 2020-12-19 DIAGNOSIS — K838 Other specified diseases of biliary tract: Secondary | ICD-10-CM

## 2020-12-19 DIAGNOSIS — K860 Alcohol-induced chronic pancreatitis: Secondary | ICD-10-CM

## 2020-12-19 DIAGNOSIS — G40909 Epilepsy, unspecified, not intractable, without status epilepticus: Secondary | ICD-10-CM | POA: Insufficient documentation

## 2020-12-19 DIAGNOSIS — E119 Type 2 diabetes mellitus without complications: Secondary | ICD-10-CM | POA: Diagnosis not present

## 2020-12-19 DIAGNOSIS — K861 Other chronic pancreatitis: Secondary | ICD-10-CM | POA: Diagnosis not present

## 2020-12-19 DIAGNOSIS — G4700 Insomnia, unspecified: Secondary | ICD-10-CM | POA: Diagnosis not present

## 2020-12-19 DIAGNOSIS — K862 Cyst of pancreas: Secondary | ICD-10-CM

## 2020-12-19 DIAGNOSIS — R634 Abnormal weight loss: Secondary | ICD-10-CM

## 2020-12-19 DIAGNOSIS — Z882 Allergy status to sulfonamides status: Secondary | ICD-10-CM | POA: Insufficient documentation

## 2020-12-19 DIAGNOSIS — K449 Diaphragmatic hernia without obstruction or gangrene: Secondary | ICD-10-CM | POA: Insufficient documentation

## 2020-12-19 DIAGNOSIS — Z7982 Long term (current) use of aspirin: Secondary | ICD-10-CM | POA: Insufficient documentation

## 2020-12-19 DIAGNOSIS — Z6820 Body mass index (BMI) 20.0-20.9, adult: Secondary | ICD-10-CM | POA: Insufficient documentation

## 2020-12-19 DIAGNOSIS — K529 Noninfective gastroenteritis and colitis, unspecified: Secondary | ICD-10-CM | POA: Diagnosis not present

## 2020-12-19 DIAGNOSIS — K8689 Other specified diseases of pancreas: Secondary | ICD-10-CM | POA: Insufficient documentation

## 2020-12-19 DIAGNOSIS — K219 Gastro-esophageal reflux disease without esophagitis: Secondary | ICD-10-CM | POA: Diagnosis not present

## 2020-12-19 DIAGNOSIS — R933 Abnormal findings on diagnostic imaging of other parts of digestive tract: Secondary | ICD-10-CM | POA: Insufficient documentation

## 2020-12-19 DIAGNOSIS — F17210 Nicotine dependence, cigarettes, uncomplicated: Secondary | ICD-10-CM | POA: Insufficient documentation

## 2020-12-19 DIAGNOSIS — Z79899 Other long term (current) drug therapy: Secondary | ICD-10-CM | POA: Insufficient documentation

## 2020-12-19 DIAGNOSIS — F411 Generalized anxiety disorder: Secondary | ICD-10-CM | POA: Diagnosis not present

## 2020-12-19 HISTORY — PX: UPPER ESOPHAGEAL ENDOSCOPIC ULTRASOUND (EUS): SHX6562

## 2020-12-19 HISTORY — PX: ESOPHAGOGASTRODUODENOSCOPY: SHX5428

## 2020-12-19 HISTORY — PX: BIOPSY: SHX5522

## 2020-12-19 LAB — GLUCOSE, CAPILLARY: Glucose-Capillary: 113 mg/dL — ABNORMAL HIGH (ref 70–99)

## 2020-12-19 SURGERY — UPPER ESOPHAGEAL ENDOSCOPIC ULTRASOUND (EUS)
Anesthesia: Monitor Anesthesia Care

## 2020-12-19 MED ORDER — PROPOFOL 500 MG/50ML IV EMUL
INTRAVENOUS | Status: DC | PRN
  Administered 2020-12-19: 175 ug/kg/min via INTRAVENOUS
  Administered 2020-12-19: 40 mg via INTRAVENOUS

## 2020-12-19 MED ORDER — LIDOCAINE HCL (CARDIAC) PF 100 MG/5ML IV SOSY
PREFILLED_SYRINGE | INTRAVENOUS | Status: DC | PRN
  Administered 2020-12-19: 60 mg via INTRAVENOUS

## 2020-12-19 MED ORDER — GLYCOPYRROLATE 0.2 MG/ML IJ SOLN
INTRAMUSCULAR | Status: DC | PRN
  Administered 2020-12-19: .2 mg via INTRAVENOUS

## 2020-12-19 MED ORDER — EPHEDRINE SULFATE 50 MG/ML IJ SOLN
INTRAMUSCULAR | Status: DC | PRN
  Administered 2020-12-19: 10 mg via INTRAVENOUS

## 2020-12-19 MED ORDER — LACTATED RINGERS IV SOLN
INTRAVENOUS | Status: DC
  Administered 2020-12-19: 1000 mL via INTRAVENOUS

## 2020-12-19 MED ORDER — SODIUM CHLORIDE 0.9 % IV SOLN: INTRAVENOUS | Status: DC

## 2020-12-19 NOTE — Discharge Instructions (Signed)
YOU HAD AN ENDOSCOPIC PROCEDURE TODAY: Refer to the procedure report and other information in the discharge instructions given to you for any specific questions about what was found during the examination. If this information does not answer your questions, please call [place] office at [phone] to clarify.  ° °YOU SHOULD EXPECT: Some feelings of bloating in the abdomen. Passage of more gas than usual. Walking can help get rid of the air that was put into your GI tract during the procedure and reduce the bloating.  ° °DIET: Your first meal following the procedure should be a light meal and then it is ok to progress to your normal diet. A half-sandwich or bowl of soup is an example of a good first meal. Heavy or fried foods are harder to digest and may make you feel nauseous or bloated. Drink plenty of fluids but you should avoid alcoholic beverages for 24 hours. ° °ACTIVITY: Your care partner should take you home directly after the procedure. You should plan to take it easy, moving slowly for the rest of the day. You can resume normal activity the day after the procedure however YOU SHOULD NOT DRIVE, use power tools, machinery or perform tasks that involve climbing or major physical exertion for 24 hours (because of the sedation medicines used during the test).  ° °SYMPTOMS TO REPORT IMMEDIATELY: °A gastroenterologist can be reached at any hour. Please call [phone]  for any of the following symptoms:  ° °Following upper endoscopy (EGD, EUS, ERCP, esophageal dilation) °Vomiting of blood or coffee ground material  °New, significant abdominal pain  °New, significant chest pain or pain under the shoulder blades  °Painful or persistently difficult swallowing  °New shortness of breath  °Black, tarry-looking or red, bloody stools ° °FOLLOW UP:  °If any biopsies were taken you will be contacted by phone or by letter within the next 1-3 weeks. Call [phone]  if you have not heard about the biopsies in 3 weeks.    °Please also call with any specific questions about appointments or follow up tests. ° °

## 2020-12-19 NOTE — Transfer of Care (Signed)
Immediate Anesthesia Transfer of Care Note  Patient: Randy Richards  Procedure(s) Performed: Procedure(s): UPPER ESOPHAGEAL ENDOSCOPIC ULTRASOUND (EUS) (N/A) ESOPHAGOGASTRODUODENOSCOPY (EGD) (N/A) BIOPSY  Patient Location: PACU  Anesthesia Type:MAC  Level of Consciousness:  sedated, patient cooperative and responds to stimulation  Airway & Oxygen Therapy:Patient Spontanous Breathing and Patient connected to face mask oxgen  Post-op Assessment:  Report given to PACU RN and Post -op Vital signs reviewed and stable  Post vital signs:  Reviewed and stable  Last Vitals:  Vitals:   12/19/20 1157 12/19/20 1324  BP: 116/66 (!) 102/59  Pulse: 64 (!) 57  Resp: 19 13  Temp: 37 C   SpO2: 74% 142%    Complications: No apparent anesthesia complications

## 2020-12-19 NOTE — Op Note (Signed)
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 Estimated Blood Loss:     Estimated blood loss was minimal. Procedure:                Pre-Anesthesia Assessment:                           - Prior to the procedure, a History and Physical                            was performed, and patient medications and                            allergies were reviewed. The patient's tolerance of                            previous anesthesia was also reviewed. The risks                            and benefits of the procedure and the sedation                            options and risks were discussed with the patient.                            All questions were answered, and informed consent                            was obtained. Prior Anticoagulants: The patient has                            taken no previous anticoagulant or antiplatelet                            agents except for aspirin. ASA Grade Assessment:  III - A patient with severe systemic disease. After                            reviewing the risks and benefits, the patient was                            deemed in satisfactory condition to undergo the                            procedure.                           After obtaining informed consent, the endoscope was                            passed under  direct vision. Throughout the                            procedure, the patient's blood pressure, pulse, and                            oxygen saturations were monitored continuously. The                            [id number]-H190 ([id number]) was introduced through the                            mouth, and advanced to the second part of duodenum.                            The [id number]-[id number] ([id number]) Olympus Duodenoscope was                            introduced through the mouth, and advanced to the                            second part of duodenum. The ([id number]) [id number]                            Linear EUS was introduced through the mouth, and                            advanced to the duodenum for ultrasound examination                            from the stomach and duodenum. The TGF-[id number]                            ([id number]) Olympus Forward View EUS was introduced                            through the mouth, and advanced to the duodenum for  ultrasound examination from the stomach and                            duodenum. The upper EUS was technically difficult                            and complex due to a J-shaped stomach which made                            pyloric intubation difficult. The patient tolerated                            the procedure. Scope In: Scope Out: Findings:      ENDOSCOPIC FINDING: :      No gross lesions were noted in the entire esophagus.      The Z-line was regular and was found 40 cm from the incisors.      A 3 cm hiatal hernia was present.      Patchy mildly erythematous mucosa without bleeding was found in the       entire examined stomach. Biopsies were taken with a cold forceps for       histology and Helicobacter pylori testing.      A deformity was found in the entire examined stomach.      No gross lesions were noted in the duodenal bulb, in the first portion       of the duodenum and in the second portion of the duodenum.       The major papilla was normal and was hidden under a hood.      ENDOSONOGRAPHIC FINDING: :      Endosonographic imaging of the ampulla showed no extrinsic compression,       intramural (subepithelial) lesion, mass, varices or wall thickening.      Anechoic and heterogenous lesions suggestive of multiple cysts were       identified in the genu of the pancreas and pancreatic body. The first       cyst measured 36 mm by 34 mm. The second cyst measured 19 mm by 22 mm.       The third cyst measured 16 mm by 17 mm. There was no associated mass. As       patient is doing well, aspiration of cysts was not performed. Can be       considered in future if patient begins to do unwell.      The pancreatic duct had a dilated endosonographic appearance, had       intraductal stones (in the body and tail), had a tortuous/ectatic       appearance and had hyperechoic walls in the pancreatic head, genu of the       pancreas, body of the pancreas and tail of the pancreas. PDH - 4.4 mm,       PDN - 5.5 mm. PDB - 8.0 mm. PDT - 6.3 mm.      Pancreatic parenchymal abnormalities were noted in the entire pancreas.       These consisted of hyperechoic foci with shadowing, lobularity without       honeycombing, cysts and hyperechoic strands.      Endosonographic imaging in the entire pancreas showed no mass. However,       due to the amount of chronic  pancreatitis and shadowing, decreased       sensitivity for finding lesions is noted on EUS procedures.      There was no sign of significant endosonographic abnormality in the       common bile duct.      Moderate hyperechoic material consistent with sludge was visualized       endosonographically in the gallbladder.      Endosonographic imaging in the visualized portion of the liver showed no       mass.      No malignant-appearing lymph nodes were visualized in the celiac region       (level 20), peripancreatic region and porta hepatis region.      The celiac  region was visualized. Impression:               EGD Impression:                           - No gross lesions in esophagus. Z-line regular, 40                            cm from the incisors.                           - 3 cm hiatal hernia.                           - Erythematous mucosa in the stomach. Biopsied.                           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                           - No gross lesions in the duodenal bulb, in the                            first portion of the duodenum and in the second                            portion of the duodenum.                           - Normal major papilla.                           EUS Impression:                           - Multiple cystic lesions were seen in the genu of                            the pancreas and pancreatic body. Tissue has not                            been obtained. 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                           - The pancreatic duct had a dilated endosonographic                            appearance, had intraductal stones, had a                            tortuous/ectatic appearance and had hyperechoic                            walls in the pancreatic head, genu of the pancreas,                            body of the pancreas and tail of the pancreas.                           - Pancreatic parenchymal abnormalities consisting                            of hyperechoic foci, lobularity, cysts and                            hyperechoic strands were noted in the entire                            pancreas.                           - No evidence of a pancreatic mass, with caveat                            that significant chronic pancreatitis can decrease                            sensitivity of finding masses/lesions and                            significant shadowing is present.                           -  There was no sign of significant pathology in the                            common bile duct.                           - Hyperechoic material consistent with sludge was                            visualized endosonographically in the gallbladder.                           - No malignant-appearing lymph nodes were  visualized in the celiac region (level 20),                            peripancreatic region and porta hepatis region. Moderate Sedation:      Not Applicable - Patient had care per Anesthesia. Recommendation:           - The patient will be observed post-procedure,                            until all discharge criteria are met.                           - Discharge patient to home.                           - Patient has a contact number available for                            emergencies. The signs and symptoms of potential                            delayed complications were discussed with the                            patient. Return to normal activities tomorrow.                            Written discharge instructions were provided to the                            patient.                           - Observe patient's clinical course.                           - Await path results.                           - Perform MRCP in 6 months.                           - If patient continues to have recurrent episodes                            of pancreatitis, reasonable to consider repeat EUS                            and aspiration and ERCP with pancreatic duct                            stenting. He has multiple stones, so there is                            chance that we may not clear the duct completely.  The query as to whether surgical evaluation should                            be had for Frye procedure or [place] may be worth                            considering.                           - Obtain Fecal  elastase when able.                           - Amylase/Lipase/HFP to be drawn within 6-12 hours                            of severe episode of pain/discomfort.                           - The findings and recommendations were discussed                            with the patient.                           - The findings and recommendations were discussed                            with the designated responsible adult. Procedure Code(s):        --- Professional ---                           [phone], Esophagogastroduodenoscopy, flexible,                            transoral; with endoscopic ultrasound examination                            limited to the esophagus, stomach or duodenum, and                            adjacent structures                           43239, Esophagogastroduodenoscopy, flexible,                            transoral; with biopsy, single or multiple Diagnosis Code(s):        --- Professional ---                           K44.9, Diaphragmatic hernia without obstruction or                            gangrene                           K31.89, Other diseases  of stomach and duodenum                           K86.2, Cyst of pancreas                           K86.89, Other specified diseases of pancreas                           R93.3, Abnormal findings on diagnostic imaging of                            other parts of digestive tract                           K86.9, Disease of pancreas, unspecified                           I89.9, Noninfective disorder of lymphatic vessels                            and lymph nodes, unspecified                           K86.1, Other chronic pancreatitis                           K83.8, Other specified diseases of biliary tract CPT copyright 2019 [employer]. All rights reserved. The codes documented in this report are preliminary and upon coder review may  be revised to meet current compliance requirements. Justice Britain, MD 12/19/2020 1:39:09 PM Number of Addenda: 0

## 2020-12-19 NOTE — Interval H&P Note (Signed)
History and Physical Interval Note:  12/19/2020 11:20 AM  Randy Richards  has presented today for surgery, with the diagnosis of Chronic Pancreatitis.  The various methods of treatment have been discussed with the patient and family. After consideration of risks, benefits and other options for treatment, the patient has consented to  Procedure(s): UPPER ESOPHAGEAL ENDOSCOPIC ULTRASOUND (EUS) (N/A) as a surgical intervention.  The patient's history has been reviewed, patient examined, no change in status, stable for surgery.  I have reviewed the patient's chart and labs.  Questions were answered to the patient's satisfaction.    The risks of an EUS including intestinal perforation, bleeding, infection, aspiration, and medication effects were discussed as was the possibility it may not give a definitive diagnosis if a biopsy is performed.  When a biopsy of the pancreas is done as part of the EUS, there is an additional risk of pancreatitis at the rate of about 1-2%.  It was explained that procedure related pancreatitis is typically mild, although it can be severe and even life threatening, which is why we do not perform random pancreatic biopsies and only biopsy a lesion/area we feel is concerning enough to warrant the risk.    [employer]

## 2020-12-19 NOTE — Anesthesia Postprocedure Evaluation (Signed)
Anesthesia Post Note  Patient: Randy Richards  Procedure(s) Performed: UPPER ESOPHAGEAL ENDOSCOPIC ULTRASOUND (EUS) ESOPHAGOGASTRODUODENOSCOPY (EGD) BIOPSY     Patient location during evaluation: Endoscopy Anesthesia Type: MAC Level of consciousness: awake and alert Pain management: pain level controlled Vital Signs Assessment: post-procedure vital signs reviewed and stable Respiratory status: spontaneous breathing, nonlabored ventilation, respiratory function stable and patient connected to nasal cannula oxygen Cardiovascular status: blood pressure returned to baseline and stable Postop Assessment: no apparent nausea or vomiting Anesthetic complications: no   No notable events documented.  Last Vitals:  Vitals:   12/19/20 1340 12/19/20 1350  BP: 126/80 125/75  Pulse: 64 (!) 33  Resp: 18 (!) 22  Temp:    SpO2: 100% 100%    Last Pain:  Vitals:   12/19/20 1324  TempSrc: Axillary  PainSc:                  Barnet Glasgow

## 2020-12-20 LAB — SURGICAL PATHOLOGY

## 2020-12-21 ENCOUNTER — Encounter: Payer: Self-pay | Admitting: Gastroenterology

## 2020-12-21 ENCOUNTER — Encounter (HOSPITAL_COMMUNITY): Payer: Self-pay | Admitting: Gastroenterology

## 2021-01-16 DIAGNOSIS — K861 Other chronic pancreatitis: Secondary | ICD-10-CM | POA: Diagnosis not present

## 2021-01-16 DIAGNOSIS — E162 Hypoglycemia, unspecified: Secondary | ICD-10-CM | POA: Diagnosis not present

## 2021-01-16 DIAGNOSIS — Z79899 Other long term (current) drug therapy: Secondary | ICD-10-CM | POA: Diagnosis not present

## 2021-01-16 DIAGNOSIS — H6121 Impacted cerumen, right ear: Secondary | ICD-10-CM | POA: Diagnosis not present

## 2021-01-18 ENCOUNTER — Ambulatory Visit: Payer: PPO | Admitting: Physician Assistant

## 2021-01-22 ENCOUNTER — Ambulatory Visit: Payer: PPO | Admitting: Physician Assistant

## 2021-02-06 DIAGNOSIS — U071 COVID-19: Secondary | ICD-10-CM | POA: Diagnosis not present

## 2021-02-13 DIAGNOSIS — K861 Other chronic pancreatitis: Secondary | ICD-10-CM | POA: Diagnosis not present

## 2021-02-13 DIAGNOSIS — Z7189 Other specified counseling: Secondary | ICD-10-CM | POA: Diagnosis not present

## 2021-02-13 DIAGNOSIS — E118 Type 2 diabetes mellitus with unspecified complications: Secondary | ICD-10-CM | POA: Diagnosis not present

## 2021-02-13 DIAGNOSIS — U071 COVID-19: Secondary | ICD-10-CM | POA: Diagnosis not present

## 2021-02-13 DIAGNOSIS — Z20828 Contact with and (suspected) exposure to other viral communicable diseases: Secondary | ICD-10-CM | POA: Diagnosis not present

## 2021-02-26 ENCOUNTER — Other Ambulatory Visit: Payer: Self-pay | Admitting: Physician Assistant

## 2021-02-27 NOTE — Telephone Encounter (Signed)
Please schedule appt

## 2021-02-27 NOTE — Telephone Encounter (Signed)
Mail box is full unable to schedule an appt

## 2021-03-05 ENCOUNTER — Other Ambulatory Visit: Payer: Self-pay | Admitting: Physician Assistant

## 2021-03-05 NOTE — Telephone Encounter (Signed)
Wife called and made an appt for 10/13. Pt is out of this medicine.

## 2021-03-06 DIAGNOSIS — Z022 Encounter for examination for admission to residential institution: Secondary | ICD-10-CM | POA: Diagnosis not present

## 2021-03-06 DIAGNOSIS — E118 Type 2 diabetes mellitus with unspecified complications: Secondary | ICD-10-CM | POA: Diagnosis not present

## 2021-03-13 DIAGNOSIS — Z87820 Personal history of traumatic brain injury: Secondary | ICD-10-CM | POA: Diagnosis not present

## 2021-03-13 DIAGNOSIS — K219 Gastro-esophageal reflux disease without esophagitis: Secondary | ICD-10-CM | POA: Diagnosis not present

## 2021-03-13 DIAGNOSIS — F419 Anxiety disorder, unspecified: Secondary | ICD-10-CM | POA: Diagnosis not present

## 2021-03-13 DIAGNOSIS — E785 Hyperlipidemia, unspecified: Secondary | ICD-10-CM | POA: Diagnosis not present

## 2021-03-13 DIAGNOSIS — F32A Depression, unspecified: Secondary | ICD-10-CM | POA: Diagnosis not present

## 2021-03-13 DIAGNOSIS — D508 Other iron deficiency anemias: Secondary | ICD-10-CM | POA: Diagnosis not present

## 2021-03-13 DIAGNOSIS — E119 Type 2 diabetes mellitus without complications: Secondary | ICD-10-CM | POA: Diagnosis not present

## 2021-03-14 DIAGNOSIS — Z79899 Other long term (current) drug therapy: Secondary | ICD-10-CM | POA: Diagnosis not present

## 2021-03-16 DIAGNOSIS — F419 Anxiety disorder, unspecified: Secondary | ICD-10-CM | POA: Diagnosis not present

## 2021-04-02 DIAGNOSIS — Z79899 Other long term (current) drug therapy: Secondary | ICD-10-CM | POA: Diagnosis not present

## 2021-04-02 DIAGNOSIS — E119 Type 2 diabetes mellitus without complications: Secondary | ICD-10-CM | POA: Diagnosis not present

## 2021-04-04 DIAGNOSIS — F32A Depression, unspecified: Secondary | ICD-10-CM | POA: Diagnosis not present

## 2021-04-04 DIAGNOSIS — F419 Anxiety disorder, unspecified: Secondary | ICD-10-CM | POA: Diagnosis not present

## 2021-04-10 DIAGNOSIS — F411 Generalized anxiety disorder: Secondary | ICD-10-CM | POA: Diagnosis not present

## 2021-04-10 DIAGNOSIS — K219 Gastro-esophageal reflux disease without esophagitis: Secondary | ICD-10-CM | POA: Diagnosis not present

## 2021-04-10 DIAGNOSIS — E119 Type 2 diabetes mellitus without complications: Secondary | ICD-10-CM | POA: Diagnosis not present

## 2021-04-10 DIAGNOSIS — F419 Anxiety disorder, unspecified: Secondary | ICD-10-CM | POA: Diagnosis not present

## 2021-04-10 DIAGNOSIS — Z87820 Personal history of traumatic brain injury: Secondary | ICD-10-CM | POA: Diagnosis not present

## 2021-04-17 DIAGNOSIS — F5105 Insomnia due to other mental disorder: Secondary | ICD-10-CM | POA: Diagnosis not present

## 2021-04-17 DIAGNOSIS — Z87820 Personal history of traumatic brain injury: Secondary | ICD-10-CM | POA: Diagnosis not present

## 2021-04-17 DIAGNOSIS — S069X0S Unspecified intracranial injury without loss of consciousness, sequela: Secondary | ICD-10-CM | POA: Diagnosis not present

## 2021-04-17 DIAGNOSIS — R2681 Unsteadiness on feet: Secondary | ICD-10-CM | POA: Diagnosis not present

## 2021-04-17 DIAGNOSIS — R410 Disorientation, unspecified: Secondary | ICD-10-CM | POA: Diagnosis not present

## 2021-04-17 DIAGNOSIS — F419 Anxiety disorder, unspecified: Secondary | ICD-10-CM | POA: Diagnosis not present

## 2021-04-18 DIAGNOSIS — D518 Other vitamin B12 deficiency anemias: Secondary | ICD-10-CM | POA: Diagnosis not present

## 2021-04-18 DIAGNOSIS — E785 Hyperlipidemia, unspecified: Secondary | ICD-10-CM | POA: Diagnosis not present

## 2021-04-18 DIAGNOSIS — N390 Urinary tract infection, site not specified: Secondary | ICD-10-CM | POA: Diagnosis not present

## 2021-04-18 DIAGNOSIS — D508 Other iron deficiency anemias: Secondary | ICD-10-CM | POA: Diagnosis not present

## 2021-04-18 DIAGNOSIS — E559 Vitamin D deficiency, unspecified: Secondary | ICD-10-CM | POA: Diagnosis not present

## 2021-04-18 DIAGNOSIS — E119 Type 2 diabetes mellitus without complications: Secondary | ICD-10-CM | POA: Diagnosis not present

## 2021-04-18 DIAGNOSIS — E038 Other specified hypothyroidism: Secondary | ICD-10-CM | POA: Diagnosis not present

## 2021-04-19 ENCOUNTER — Ambulatory Visit: Payer: PPO | Admitting: Physician Assistant

## 2021-04-20 DIAGNOSIS — R739 Hyperglycemia, unspecified: Secondary | ICD-10-CM | POA: Diagnosis not present

## 2021-04-24 DIAGNOSIS — Z79899 Other long term (current) drug therapy: Secondary | ICD-10-CM | POA: Diagnosis not present

## 2021-04-24 DIAGNOSIS — E119 Type 2 diabetes mellitus without complications: Secondary | ICD-10-CM | POA: Diagnosis not present

## 2021-04-24 DIAGNOSIS — E7849 Other hyperlipidemia: Secondary | ICD-10-CM | POA: Diagnosis not present

## 2021-04-24 DIAGNOSIS — D518 Other vitamin B12 deficiency anemias: Secondary | ICD-10-CM | POA: Diagnosis not present

## 2021-04-24 DIAGNOSIS — E559 Vitamin D deficiency, unspecified: Secondary | ICD-10-CM | POA: Diagnosis not present

## 2021-04-24 DIAGNOSIS — E038 Other specified hypothyroidism: Secondary | ICD-10-CM | POA: Diagnosis not present

## 2021-04-25 DIAGNOSIS — F32A Depression, unspecified: Secondary | ICD-10-CM | POA: Diagnosis not present

## 2021-04-25 DIAGNOSIS — F419 Anxiety disorder, unspecified: Secondary | ICD-10-CM | POA: Diagnosis not present

## 2021-04-27 DIAGNOSIS — R945 Abnormal results of liver function studies: Secondary | ICD-10-CM | POA: Diagnosis not present

## 2021-05-08 DIAGNOSIS — D508 Other iron deficiency anemias: Secondary | ICD-10-CM | POA: Diagnosis not present

## 2021-05-08 DIAGNOSIS — K219 Gastro-esophageal reflux disease without esophagitis: Secondary | ICD-10-CM | POA: Diagnosis not present

## 2021-05-08 DIAGNOSIS — E119 Type 2 diabetes mellitus without complications: Secondary | ICD-10-CM | POA: Diagnosis not present

## 2021-05-08 DIAGNOSIS — K861 Other chronic pancreatitis: Secondary | ICD-10-CM | POA: Diagnosis not present

## 2021-05-10 DIAGNOSIS — F419 Anxiety disorder, unspecified: Secondary | ICD-10-CM | POA: Diagnosis not present

## 2021-05-10 DIAGNOSIS — Z87820 Personal history of traumatic brain injury: Secondary | ICD-10-CM | POA: Diagnosis not present

## 2021-05-13 DIAGNOSIS — E559 Vitamin D deficiency, unspecified: Secondary | ICD-10-CM | POA: Diagnosis not present

## 2021-05-13 DIAGNOSIS — E785 Hyperlipidemia, unspecified: Secondary | ICD-10-CM | POA: Diagnosis not present

## 2021-05-13 DIAGNOSIS — E038 Other specified hypothyroidism: Secondary | ICD-10-CM | POA: Diagnosis not present

## 2021-05-13 DIAGNOSIS — D508 Other iron deficiency anemias: Secondary | ICD-10-CM | POA: Diagnosis not present

## 2021-05-13 DIAGNOSIS — D518 Other vitamin B12 deficiency anemias: Secondary | ICD-10-CM | POA: Diagnosis not present

## 2021-05-13 DIAGNOSIS — E119 Type 2 diabetes mellitus without complications: Secondary | ICD-10-CM | POA: Diagnosis not present

## 2021-06-05 DIAGNOSIS — K861 Other chronic pancreatitis: Secondary | ICD-10-CM | POA: Diagnosis not present

## 2021-06-05 DIAGNOSIS — E119 Type 2 diabetes mellitus without complications: Secondary | ICD-10-CM | POA: Diagnosis not present

## 2021-06-05 DIAGNOSIS — K219 Gastro-esophageal reflux disease without esophagitis: Secondary | ICD-10-CM | POA: Diagnosis not present

## 2021-06-05 DIAGNOSIS — D508 Other iron deficiency anemias: Secondary | ICD-10-CM | POA: Diagnosis not present

## 2021-06-05 DIAGNOSIS — E785 Hyperlipidemia, unspecified: Secondary | ICD-10-CM | POA: Diagnosis not present

## 2021-06-05 DIAGNOSIS — R7401 Elevation of levels of liver transaminase levels: Secondary | ICD-10-CM | POA: Diagnosis not present

## 2021-06-06 DIAGNOSIS — E1165 Type 2 diabetes mellitus with hyperglycemia: Secondary | ICD-10-CM | POA: Diagnosis not present

## 2021-06-07 DIAGNOSIS — F32A Depression, unspecified: Secondary | ICD-10-CM | POA: Diagnosis not present

## 2021-06-07 DIAGNOSIS — F419 Anxiety disorder, unspecified: Secondary | ICD-10-CM | POA: Diagnosis not present

## 2021-06-18 DIAGNOSIS — D508 Other iron deficiency anemias: Secondary | ICD-10-CM | POA: Diagnosis not present

## 2021-06-18 DIAGNOSIS — D518 Other vitamin B12 deficiency anemias: Secondary | ICD-10-CM | POA: Diagnosis not present

## 2021-06-18 DIAGNOSIS — E119 Type 2 diabetes mellitus without complications: Secondary | ICD-10-CM | POA: Diagnosis not present

## 2021-06-18 DIAGNOSIS — E785 Hyperlipidemia, unspecified: Secondary | ICD-10-CM | POA: Diagnosis not present

## 2021-06-18 DIAGNOSIS — E559 Vitamin D deficiency, unspecified: Secondary | ICD-10-CM | POA: Diagnosis not present

## 2021-06-18 DIAGNOSIS — E038 Other specified hypothyroidism: Secondary | ICD-10-CM | POA: Diagnosis not present

## 2021-06-20 DIAGNOSIS — Z87820 Personal history of traumatic brain injury: Secondary | ICD-10-CM | POA: Diagnosis not present

## 2021-06-20 DIAGNOSIS — F419 Anxiety disorder, unspecified: Secondary | ICD-10-CM | POA: Diagnosis not present

## 2021-06-22 ENCOUNTER — Other Ambulatory Visit: Payer: Self-pay

## 2021-06-22 ENCOUNTER — Telehealth: Payer: Self-pay

## 2021-06-22 DIAGNOSIS — R197 Diarrhea, unspecified: Secondary | ICD-10-CM

## 2021-06-22 DIAGNOSIS — R634 Abnormal weight loss: Secondary | ICD-10-CM

## 2021-06-22 DIAGNOSIS — K861 Other chronic pancreatitis: Secondary | ICD-10-CM

## 2021-06-22 NOTE — Telephone Encounter (Signed)
Pt was scheduled for an MR Abdomen MRCP w/wo contrast at [facility] 06/29/2021 at 7:00 AM. Pt to arrive at 6:30.  Pt scheduled for a follow up appointment 07/05/2021 at 8:30 with [name] in [facility]. Left message for pt to call back .

## 2021-06-26 NOTE — Telephone Encounter (Signed)
Left message for Randy Richards to call back. [phone].  Pt caretaker

## 2021-06-26 NOTE — Telephone Encounter (Signed)
Left message for Randy Richards to call back. [phone].  Pt caretaker

## 2021-06-27 DIAGNOSIS — F419 Anxiety disorder, unspecified: Secondary | ICD-10-CM | POA: Diagnosis not present

## 2021-06-27 DIAGNOSIS — F32A Depression, unspecified: Secondary | ICD-10-CM | POA: Diagnosis not present

## 2021-06-27 NOTE — Telephone Encounter (Signed)
Notified pt Legal Guardian Cletus Gash of date and time of the following: Pt was scheduled for an MR Abdomen MRCP w/wo contrast at [facility] 06/29/2021 at 7:00 AM. Pt to arrive at 6:30.  Pt scheduled for a follow up appointment 07/05/2021 at 8:30 with [name] in [facility]. Number for [name] given by Cletus Gash  [phone]   ( Number is incorrect) ( Number is Unavailable) Cletus Gash stated that he would talk to [name] in regard to these dates and times of appointments. Cletus Gash verbalized understanding with all questions answered.

## 2021-06-27 NOTE — Telephone Encounter (Signed)
Pt caregiver [name] states that pt is currently on Lock Down in a facility because of Covid and that Future appointments need to be rescheduled. MR Abdomen MRCP w/wo contrast at [facility] 06/29/2021 at 7:00 AM was CANCELED Follow up appointment 07/05/2021 at 8:30 with [name] in [place] stated that she will call back next week and try and reschedule.

## 2021-06-27 NOTE — Telephone Encounter (Signed)
Inbound call from [place] who states patient will not be able to have MRI 06/29/2021 because he is now in an assist living facility and they are on lock down due to covid exposure.  Wants to reschedule appts.  Please advise.

## 2021-06-29 ENCOUNTER — Ambulatory Visit (HOSPITAL_COMMUNITY): Admission: RE | Admit: 2021-06-29 | Payer: PPO | Source: Ambulatory Visit

## 2021-07-03 DIAGNOSIS — Z794 Long term (current) use of insulin: Secondary | ICD-10-CM | POA: Diagnosis not present

## 2021-07-03 DIAGNOSIS — S069X0S Unspecified intracranial injury without loss of consciousness, sequela: Secondary | ICD-10-CM | POA: Diagnosis not present

## 2021-07-03 DIAGNOSIS — E1165 Type 2 diabetes mellitus with hyperglycemia: Secondary | ICD-10-CM | POA: Diagnosis not present

## 2021-07-03 DIAGNOSIS — K219 Gastro-esophageal reflux disease without esophagitis: Secondary | ICD-10-CM | POA: Diagnosis not present

## 2021-07-04 NOTE — Telephone Encounter (Signed)
Randy Richards is calling back asked that you give her a call back.

## 2021-07-04 NOTE — Telephone Encounter (Signed)
Randy Richards made aware of the appointments that have been rescheduled. Pt scheduled for MRI on 07/18/2021 at 1:00. Pt to arrive at 12:30. Nothing to eat or drink 4 hours prior. Randy Richards made aware.  Pt scheduled for an OV with Randy Richards on 07/20/2021 at 11:20 Randy Richards made aware Randy Richards Verbalized understanding with all questions answered.

## 2021-07-05 ENCOUNTER — Ambulatory Visit: Payer: PPO | Admitting: Gastroenterology

## 2021-07-05 DIAGNOSIS — E1165 Type 2 diabetes mellitus with hyperglycemia: Secondary | ICD-10-CM | POA: Diagnosis not present

## 2021-07-17 NOTE — Telephone Encounter (Signed)
Unable to reach Randy Richards by phone: Unable to leave message due to voice mail box has not been set up yet.

## 2021-07-17 NOTE — Telephone Encounter (Addendum)
Randy Richards pt caregiver called in states patient tested positive for COVID. Patient needs MRI reschedule. I reschedule OV with [name] for 1/31 but unable to reschedule MRI. Best contact [phone]

## 2021-07-18 ENCOUNTER — Ambulatory Visit (HOSPITAL_COMMUNITY): Admission: RE | Admit: 2021-07-18 | Payer: PPO | Source: Ambulatory Visit

## 2021-07-18 ENCOUNTER — Other Ambulatory Visit: Payer: Self-pay | Admitting: Gastroenterology

## 2021-07-18 DIAGNOSIS — F419 Anxiety disorder, unspecified: Secondary | ICD-10-CM | POA: Diagnosis not present

## 2021-07-18 DIAGNOSIS — Z87820 Personal history of traumatic brain injury: Secondary | ICD-10-CM | POA: Diagnosis not present

## 2021-07-18 DIAGNOSIS — R634 Abnormal weight loss: Secondary | ICD-10-CM

## 2021-07-18 DIAGNOSIS — R197 Diarrhea, unspecified: Secondary | ICD-10-CM

## 2021-07-18 DIAGNOSIS — F5102 Adjustment insomnia: Secondary | ICD-10-CM | POA: Diagnosis not present

## 2021-07-18 DIAGNOSIS — K861 Other chronic pancreatitis: Secondary | ICD-10-CM

## 2021-07-18 NOTE — Telephone Encounter (Signed)
[name] stated that Randy Richards has tested positive for Covid and is confined to his room at the facility where he lives. [name] was given the number to Radiology [phone]) to rescheduled MRI. [name] Verbalized understanding with all questions answered.

## 2021-07-19 DIAGNOSIS — Z20828 Contact with and (suspected) exposure to other viral communicable diseases: Secondary | ICD-10-CM | POA: Diagnosis not present

## 2021-07-19 DIAGNOSIS — U071 COVID-19: Secondary | ICD-10-CM | POA: Diagnosis not present

## 2021-07-20 ENCOUNTER — Ambulatory Visit: Payer: PPO | Admitting: Gastroenterology

## 2021-07-26 DIAGNOSIS — U071 COVID-19: Secondary | ICD-10-CM | POA: Diagnosis not present

## 2021-07-26 DIAGNOSIS — Z20828 Contact with and (suspected) exposure to other viral communicable diseases: Secondary | ICD-10-CM | POA: Diagnosis not present

## 2021-07-30 DIAGNOSIS — F419 Anxiety disorder, unspecified: Secondary | ICD-10-CM | POA: Diagnosis not present

## 2021-07-30 DIAGNOSIS — F32A Depression, unspecified: Secondary | ICD-10-CM | POA: Diagnosis not present

## 2021-07-31 DIAGNOSIS — E1165 Type 2 diabetes mellitus with hyperglycemia: Secondary | ICD-10-CM | POA: Diagnosis not present

## 2021-07-31 DIAGNOSIS — F32A Depression, unspecified: Secondary | ICD-10-CM | POA: Diagnosis not present

## 2021-07-31 DIAGNOSIS — F411 Generalized anxiety disorder: Secondary | ICD-10-CM | POA: Diagnosis not present

## 2021-07-31 DIAGNOSIS — Z794 Long term (current) use of insulin: Secondary | ICD-10-CM | POA: Diagnosis not present

## 2021-07-31 DIAGNOSIS — K861 Other chronic pancreatitis: Secondary | ICD-10-CM | POA: Diagnosis not present

## 2021-08-01 DIAGNOSIS — U071 COVID-19: Secondary | ICD-10-CM | POA: Diagnosis not present

## 2021-08-01 DIAGNOSIS — Z20828 Contact with and (suspected) exposure to other viral communicable diseases: Secondary | ICD-10-CM | POA: Diagnosis not present

## 2021-08-01 DIAGNOSIS — Z111 Encounter for screening for respiratory tuberculosis: Secondary | ICD-10-CM | POA: Diagnosis not present

## 2021-08-02 DIAGNOSIS — E119 Type 2 diabetes mellitus without complications: Secondary | ICD-10-CM | POA: Diagnosis not present

## 2021-08-02 DIAGNOSIS — E559 Vitamin D deficiency, unspecified: Secondary | ICD-10-CM | POA: Diagnosis not present

## 2021-08-02 DIAGNOSIS — E785 Hyperlipidemia, unspecified: Secondary | ICD-10-CM | POA: Diagnosis not present

## 2021-08-02 DIAGNOSIS — E1165 Type 2 diabetes mellitus with hyperglycemia: Secondary | ICD-10-CM | POA: Diagnosis not present

## 2021-08-02 DIAGNOSIS — D518 Other vitamin B12 deficiency anemias: Secondary | ICD-10-CM | POA: Diagnosis not present

## 2021-08-02 DIAGNOSIS — D508 Other iron deficiency anemias: Secondary | ICD-10-CM | POA: Diagnosis not present

## 2021-08-02 DIAGNOSIS — R011 Cardiac murmur, unspecified: Secondary | ICD-10-CM | POA: Diagnosis not present

## 2021-08-02 DIAGNOSIS — E038 Other specified hypothyroidism: Secondary | ICD-10-CM | POA: Diagnosis not present

## 2021-08-03 DIAGNOSIS — E1165 Type 2 diabetes mellitus with hyperglycemia: Secondary | ICD-10-CM | POA: Diagnosis not present

## 2021-08-07 ENCOUNTER — Encounter: Payer: Self-pay | Admitting: Gastroenterology

## 2021-08-07 ENCOUNTER — Other Ambulatory Visit (INDEPENDENT_AMBULATORY_CARE_PROVIDER_SITE_OTHER): Payer: PPO

## 2021-08-07 ENCOUNTER — Ambulatory Visit (INDEPENDENT_AMBULATORY_CARE_PROVIDER_SITE_OTHER): Payer: Self-pay | Admitting: Gastroenterology

## 2021-08-07 ENCOUNTER — Other Ambulatory Visit: Payer: Self-pay

## 2021-08-07 VITALS — BP 98/70 | HR 72 | Ht 67.0 in | Wt 143.2 lb

## 2021-08-07 DIAGNOSIS — R197 Diarrhea, unspecified: Secondary | ICD-10-CM

## 2021-08-07 DIAGNOSIS — K861 Other chronic pancreatitis: Secondary | ICD-10-CM | POA: Diagnosis not present

## 2021-08-07 DIAGNOSIS — K8020 Calculus of gallbladder without cholecystitis without obstruction: Secondary | ICD-10-CM

## 2021-08-07 DIAGNOSIS — R634 Abnormal weight loss: Secondary | ICD-10-CM

## 2021-08-07 DIAGNOSIS — R933 Abnormal findings on diagnostic imaging of other parts of digestive tract: Secondary | ICD-10-CM | POA: Diagnosis not present

## 2021-08-07 LAB — COMPREHENSIVE METABOLIC PANEL
ALT: 10 U/L (ref 0–53)
AST: 9 U/L (ref 0–37)
Albumin: 4.7 g/dL (ref 3.5–5.2)
Alkaline Phosphatase: 30 U/L — ABNORMAL LOW (ref 39–117)
BUN: 13 mg/dL (ref 6–23)
CO2: 33 mEq/L — ABNORMAL HIGH (ref 19–32)
Calcium: 9.8 mg/dL (ref 8.4–10.5)
Chloride: 103 mEq/L (ref 96–112)
Creatinine, Ser: 0.82 mg/dL (ref 0.40–1.50)
GFR: 101.79 mL/min (ref >60.00)
Glucose, Bld: 332 mg/dL — ABNORMAL HIGH (ref 70–99)
Potassium: 4.5 mEq/L (ref 3.5–5.1)
Sodium: 142 mEq/L (ref 135–145)
Total Bilirubin: 0.3 mg/dL (ref 0.2–1.2)
Total Protein: 6.9 g/dL (ref 6.0–8.3)

## 2021-08-07 LAB — LIPASE: Lipase: 167 U/L — ABNORMAL HIGH (ref 11.0–59.0)

## 2021-08-07 MED ORDER — PANCRELIPASE (LIP-PROT-AMYL) 36000-114000 UNITS PO CPEP: ORAL_CAPSULE | ORAL | 4 refills | Status: AC
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
## 2021-08-07 NOTE — Patient Instructions (Addendum)
If you are age 52 or younger, your body mass index should be between 19-25. Your Body mass index is 22.44 kg/m. If this is out of the aformentioned range listed, please consider follow up with your Primary Care Provider.   __________________________________________________________  The [facility] providers would like to encourage you to use [facility] to communicate with providers for non-urgent requests or questions.  Due to long hold times on the telephone, sending your provider a message by [facility] may be a faster and more efficient way to get a response.  Please allow 48 business hours for a response.  Please remember that this is for non-urgent requests.   Due to recent changes in healthcare laws, you may see the results of your imaging and laboratory studies on MyChart before your provider has had a chance to review them.  We understand that in some cases there may be results that are confusing or concerning to you. Not all laboratory results come back in the same time frame and the provider may be waiting for multiple results in order to interpret others.  Please give [place] 48 hours in order for your provider to thoroughly review all the results before contacting the office for clarification of your results.   Please go to the lab on the 2nd floor [address] before you leave the office today.    You will be contacted by [facility] in the next 2 days to arrange an appointment for MRCP. The number on your caller ID will be [phone], please answer when they call.  If you have not heard from them in 2 days please call [phone] to schedule.    Eat small but more frequent meals.  Stop smoking. We have given you Patient Assistant paperwork for Creon. Please complete and mail back to the company We have given you samples of Creon today.  Please follow up in 6 months. Give [place] a call at [phone] to schedule an appointment.   It was a pleasure to see you today!  Jackquline Denmark, M.D.

## 2021-08-08 LAB — CBC WITH DIFFERENTIAL
Basophils Absolute: 0.1 10*3/uL (ref 0.0–0.2)
Basos: 1 %
EOS (ABSOLUTE): 0.1 10*3/uL (ref 0.0–0.4)
Eos: 2 %
Hematocrit: 40 % (ref 37.5–51.0)
Hemoglobin: 13.3 g/dL (ref 13.0–17.7)
Immature Grans (Abs): 0 10*3/uL (ref 0.0–0.1)
Immature Granulocytes: 1 %
Lymphocytes Absolute: 2.6 10*3/uL (ref 0.7–3.1)
Lymphs: 41 %
MCH: 33.3 pg — ABNORMAL HIGH (ref 26.6–33.0)
MCHC: 33.3 g/dL (ref 31.5–35.7)
MCV: 100 fL — ABNORMAL HIGH (ref 79–97)
Monocytes Absolute: 0.4 10*3/uL (ref 0.1–0.9)
Monocytes: 7 %
Neutrophils Absolute: 3.2 10*3/uL (ref 1.4–7.0)
Neutrophils: 48 %
RBC: 4 x10E6/uL — ABNORMAL LOW (ref 4.14–5.80)
RDW: 11.9 % (ref 11.6–15.4)
WBC: 6.4 10*3/uL (ref 3.4–10.8)

## 2021-08-08 LAB — CANCER ANTIGEN 19-9: CA 19-9: 25 U/mL (ref <34)

## 2021-08-15 DIAGNOSIS — F419 Anxiety disorder, unspecified: Secondary | ICD-10-CM | POA: Diagnosis not present

## 2021-08-15 DIAGNOSIS — Z87820 Personal history of traumatic brain injury: Secondary | ICD-10-CM | POA: Diagnosis not present

## 2021-08-15 DIAGNOSIS — F5102 Adjustment insomnia: Secondary | ICD-10-CM | POA: Diagnosis not present

## 2021-08-16 ENCOUNTER — Ambulatory Visit (HOSPITAL_COMMUNITY)
Admission: RE | Admit: 2021-08-16 | Discharge: 2021-08-16 | Disposition: A | Discharge status: Home / Self Care | Payer: PPO | Source: Ambulatory Visit | Attending: Gastroenterology | Admitting: Gastroenterology

## 2021-08-16 ENCOUNTER — Other Ambulatory Visit: Payer: Self-pay

## 2021-08-16 DIAGNOSIS — K861 Other chronic pancreatitis: Secondary | ICD-10-CM | POA: Diagnosis not present

## 2021-08-16 DIAGNOSIS — R634 Abnormal weight loss: Secondary | ICD-10-CM | POA: Insufficient documentation

## 2021-08-16 DIAGNOSIS — F32A Depression, unspecified: Secondary | ICD-10-CM | POA: Diagnosis not present

## 2021-08-16 DIAGNOSIS — R197 Diarrhea, unspecified: Secondary | ICD-10-CM | POA: Insufficient documentation

## 2021-08-16 DIAGNOSIS — F419 Anxiety disorder, unspecified: Secondary | ICD-10-CM | POA: Diagnosis not present

## 2021-08-16 DIAGNOSIS — K8020 Calculus of gallbladder without cholecystitis without obstruction: Secondary | ICD-10-CM | POA: Diagnosis not present

## 2021-08-16 IMAGING — MR MR ABDOMEN WO/W CM MRCP
19 of 22 series · 45 of 48 positions shown · IV contrast (6ml GADAVIST)
Comparison: [DATE]

CLINICAL DATA: Weight loss. Diarrhea. Recurrent pancreatitis.
Cystic pancreatic lesions.

EXAM:
MRI ABDOMEN WITHOUT AND WITH CONTRAST (INCLUDING MRCP)
TECHNIQUE: Multiplanar multisequence MR imaging of the abdomen was performed
both before and after the administration of intravenous contrast.
Heavily T2-weighted images of the biliary and pancreatic ducts were
obtained, and three-dimensional MRCP images were rendered by post
processing.
CONTRAST:  6mL GADAVIST GADOBUTROL 1 MMOL/ML IV SOLN

[Series 4: T2 fat-sat · axial · 6.0mm · 1.25mm/px · 1 of 40 slices shown]
[im 1/40]
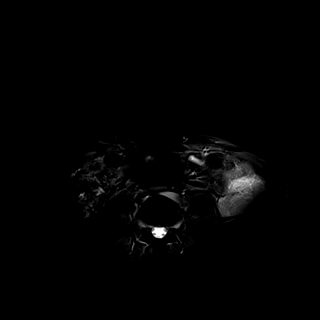

[Series 6: DWI · axial · 6.0mm · 1.49mm/px · z∈[-168,+120]mm · 3 of 82 slices shown (1 of 2)]
[im 1/82]
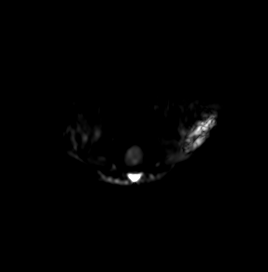
[im 41/82]
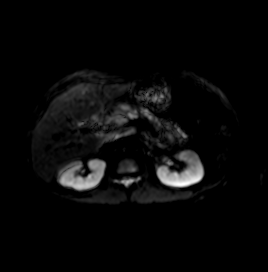
[im 82/82]
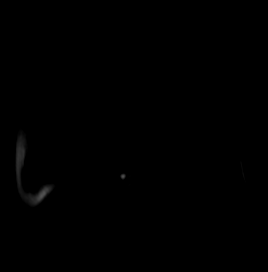

[Series 7: DWI · axial · 6.0mm · 1.49mm/px · 1 of 41 slices shown (2 of 2)]
[im 1/41]
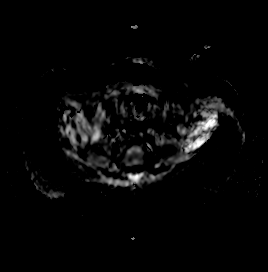

[Series 9: T2 · coronal · 6.0mm · 1.48mm/px · 1 of 34 slices shown (1 of 2)]
[im 1/34]
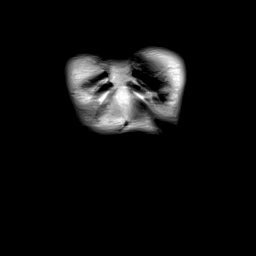

[Series 10: cor_3d_spc_trig · coronal · 1.0mm · 0.49mm/px · 2 of 72 slices shown]
[im 1/72]
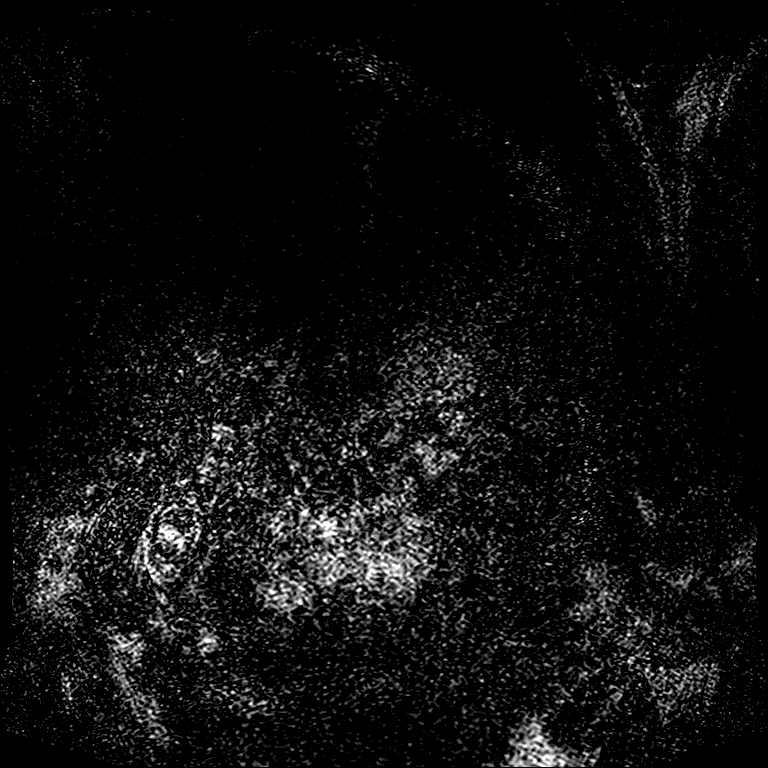
[im 72/72]
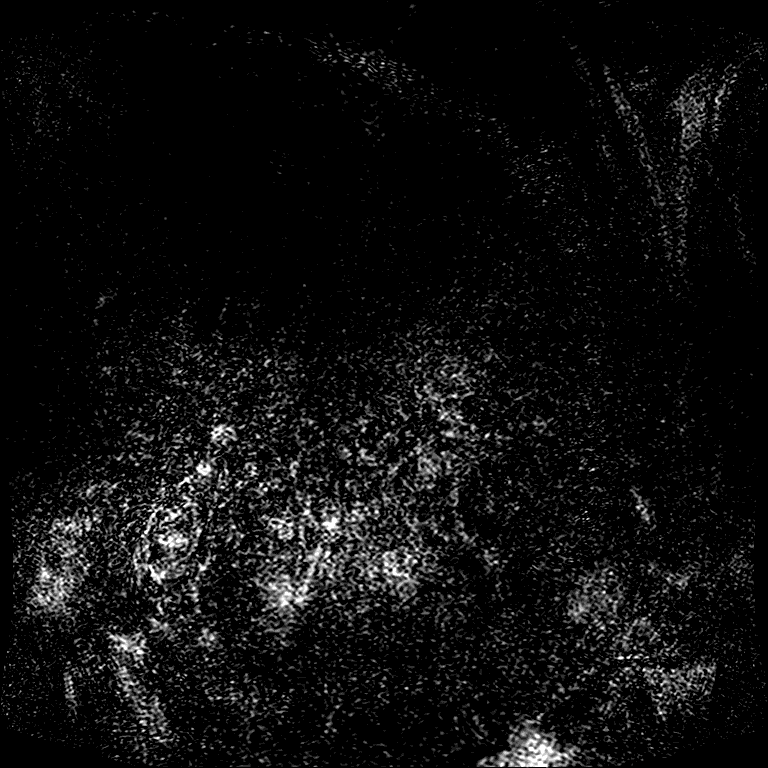

[Series 12: T1 · axial · 3.0mm · 1.25mm/px · z∈[-139,+122]mm · 3 of 88 slices shown (1 of 2)]
[im 1/88]
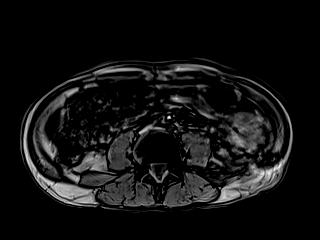
[im 44/88]
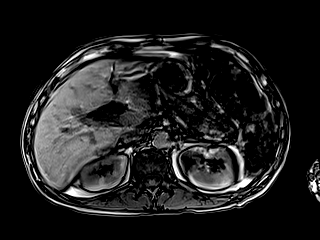
[im 88/88]
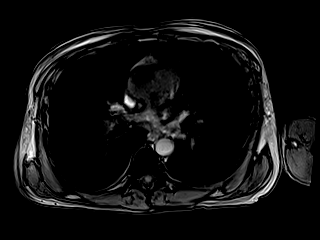

[Series 13: T1 · axial · 3.0mm · 1.25mm/px · z∈[-139,+122]mm · 3 of 88 slices shown (2 of 2)]
[im 1/88]
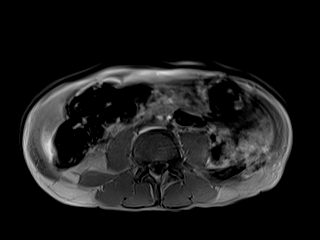
[im 44/88]
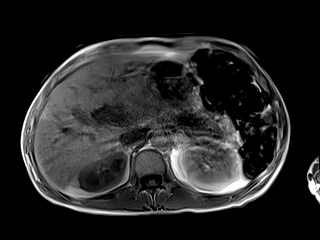
[im 88/88]
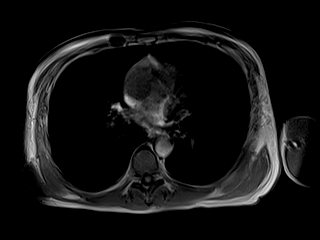

[Series 14: cor obl thk · sagittal · 50.0mm · 0.78mm/px · 1 of 9 slices shown]
[im 1/9]
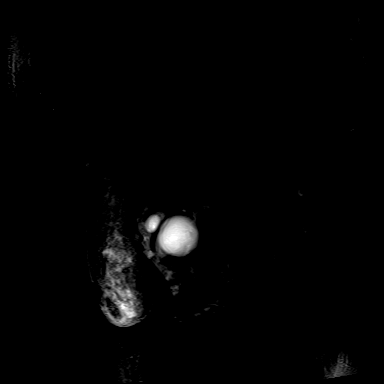

[Series 16: T2 · axial · 6.0mm · 1.56mm/px · 1 of 41 slices shown (2 of 2)]
[im 1/41]
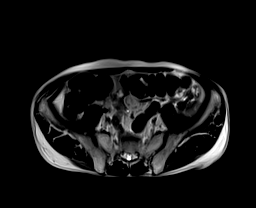

[Series 18: T1 dynamic · axial · 3.0mm · 1.25mm/px · z∈[-188,+97]mm · 3 of 96 slices shown (1 of 10)]
[im 1/96]
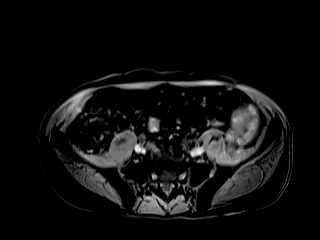
[im 48/96]
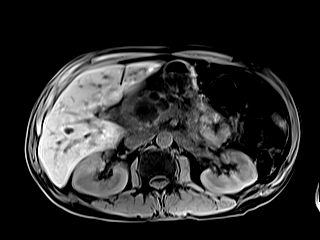
[im 96/96]
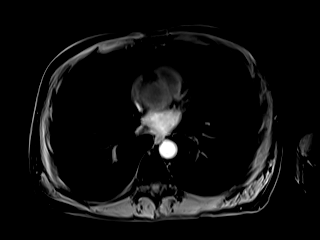

[Series 22: T1 dynamic · axial · 3.0mm · 1.25mm/px · z∈[-188,+97]mm · 3 of 96 slices shown (2 of 10)]
[im 1/96]
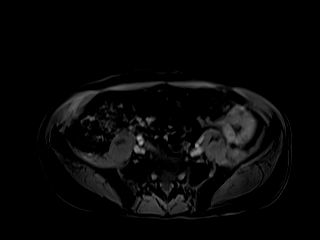
[im 48/96]
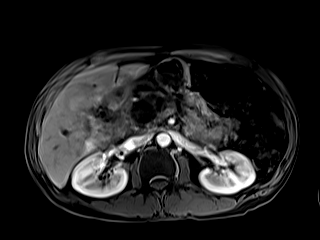
[im 96/96]
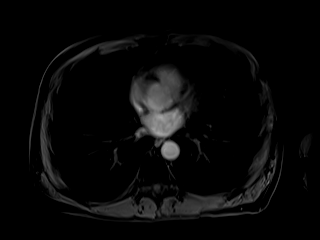

[Series 23: T1 dynamic · axial · 3.0mm · 1.25mm/px · z∈[-188,+97]mm · 3 of 96 slices shown (3 of 10)]
[im 1/96]
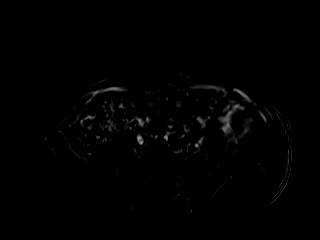
[im 48/96]
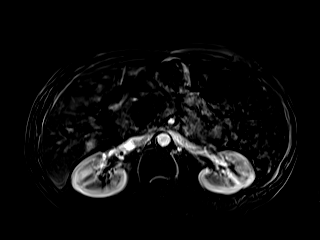
[im 96/96]
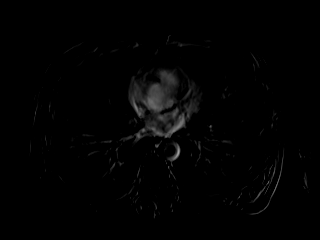

[Series 26: T1 dynamic · axial · 3.0mm · 1.25mm/px · z∈[-188,+97]mm · 3 of 96 slices shown (4 of 10)]
[im 1/96]
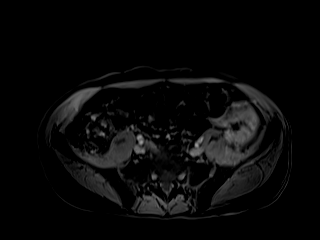
[im 48/96]
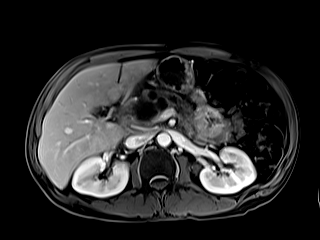
[im 96/96]
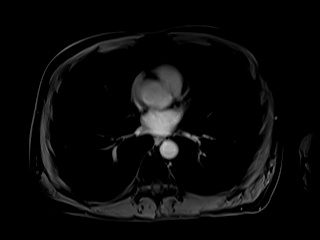

[Series 27: T1 dynamic · axial · 3.0mm · 1.25mm/px · z∈[-188,+97]mm · 3 of 96 slices shown (5 of 10)]
[im 1/96]
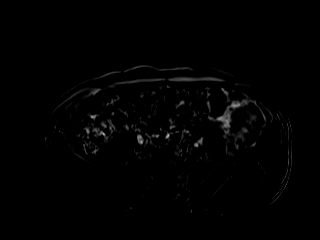
[im 48/96]
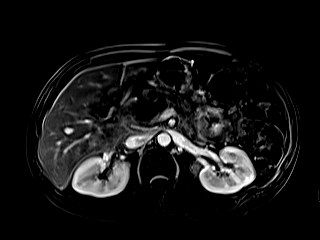
[im 96/96]
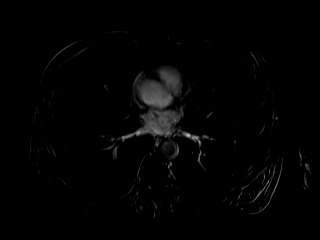

[Series 30: T1 dynamic · axial · 3.0mm · 1.25mm/px · z∈[-188,+97]mm · 3 of 96 slices shown (6 of 10)]
[im 1/96]
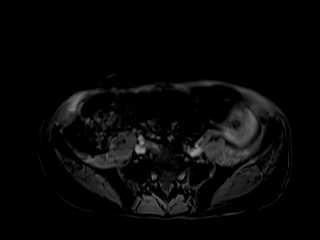
[im 48/96]
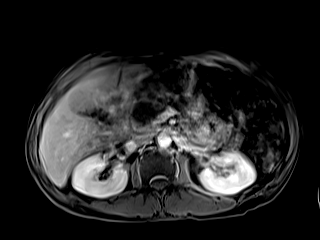
[im 96/96]
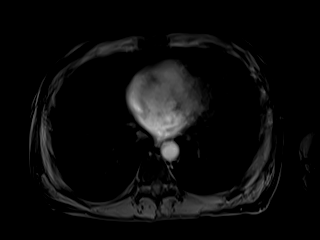

[Series 31: T1 dynamic · axial · 3.0mm · 1.25mm/px · z∈[-188,+97]mm · 3 of 96 slices shown (7 of 10)]
[im 1/96]
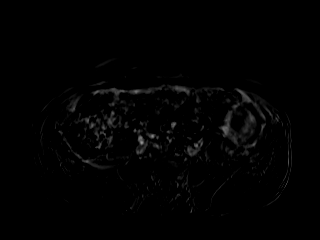
[im 48/96]
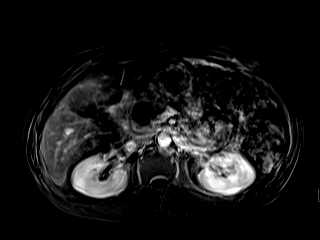
[im 96/96]
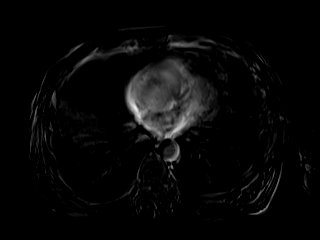

[Series 33: T1 dynamic · coronal · 4.0mm · 1.41mm/px · 2 of 60 slices shown (8 of 10)]
[im 1/60]
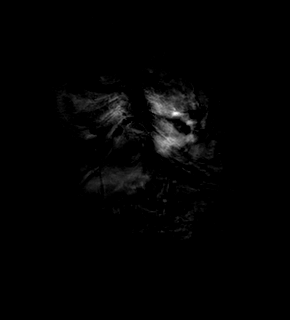
[im 60/60]
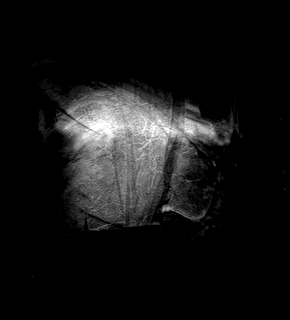

[Series 36: T1 dynamic · axial · 3.0mm · 1.25mm/px · z∈[-188,+97]mm · 3 of 96 slices shown (9 of 10)]
[im 1/96]
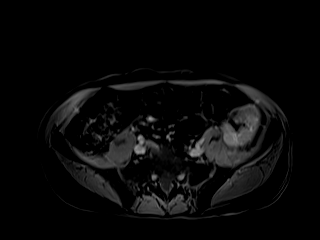
[im 48/96]
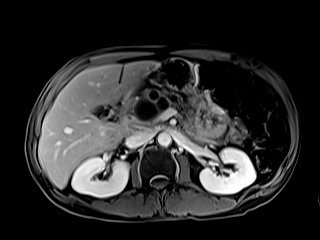
[im 96/96]
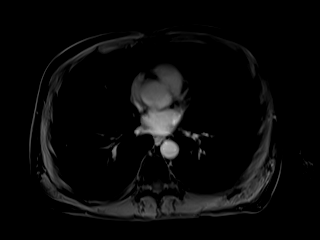

[Series 37: T1 dynamic · axial · 3.0mm · 1.25mm/px · z∈[-188,+97]mm · 3 of 96 slices shown (10 of 10)]
[im 1/96]
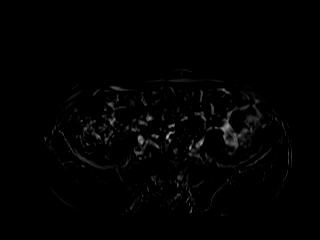
[im 48/96]
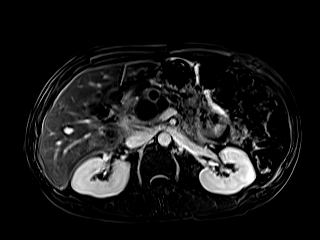
[im 96/96]
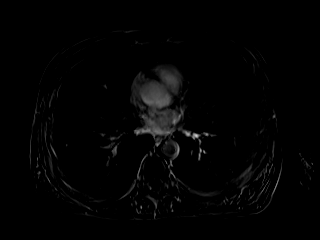

[45 of 48 positions shown; findings below may reference images not displayed]

FINDINGS: Lower chest: No acute findings.

Hepatobiliary: No hepatic masses identified. Small gallstones are
again seen, however there is no evidence of cholecystitis or biliary
ductal dilatation.

Pancreas: Diffuse dilatation of the main pancreatic duct is seen, as
well as dilatation of side branches in the pancreatic body and tail.
These findings are consistent with chronic pancreatitis. Several
cystic lesions are again seen in the pancreatic head and body,
largest in the pancreatic head measuring 3.6 x 2.7 cm, without
significant change since previous study. Two adjacent smaller cysts
measuring 1.6 cm and 1.1 cm show mild decrease in size since
previous study. No new or enlarging lesions identified. No evidence
of acute peripancreatic inflammatory changes or peripancreatic fluid
collections.

Spleen:  Within normal limits in size and appearance.

Adrenals/Urinary Tract: No masses identified. No evidence of
hydronephrosis.

Stomach/Bowel: Unremarkable.

Vascular/Lymphatic: No pathologically enlarged lymph nodes
identified. No acute vascular findings.

Other:  None.

Musculoskeletal:  No suspicious bone lesions identified.
IMPRESSION: Chronic pancreatitis. No radiographic findings of acute
pancreatitis.

Multiple cystic lesions in the pancreatic head and body, which are
stable or mildly decreased since previous study. These are
consistent with pancreatic pseudocysts in the setting of
pancreatitis.

Cholelithiasis. No radiographic evidence of cholecystitis or biliary
ductal dilatation.

## 2021-08-16 MED ORDER — GADOBUTROL 1 MMOL/ML IV SOLN
6.0000 mL | Freq: Once | INTRAVENOUS | Status: AC | PRN
  Administered 2021-08-16: 6 mL via INTRAVENOUS

## 2021-08-18 ENCOUNTER — Other Ambulatory Visit: Payer: Self-pay | Admitting: Gastroenterology

## 2021-08-18 DIAGNOSIS — R634 Abnormal weight loss: Secondary | ICD-10-CM

## 2021-08-18 DIAGNOSIS — R197 Diarrhea, unspecified: Secondary | ICD-10-CM

## 2021-08-18 DIAGNOSIS — K861 Other chronic pancreatitis: Secondary | ICD-10-CM

## 2021-08-28 DIAGNOSIS — E1165 Type 2 diabetes mellitus with hyperglycemia: Secondary | ICD-10-CM | POA: Diagnosis not present

## 2021-08-28 DIAGNOSIS — F419 Anxiety disorder, unspecified: Secondary | ICD-10-CM | POA: Diagnosis not present

## 2021-08-28 DIAGNOSIS — K861 Other chronic pancreatitis: Secondary | ICD-10-CM | POA: Diagnosis not present

## 2021-08-28 DIAGNOSIS — F32A Depression, unspecified: Secondary | ICD-10-CM | POA: Diagnosis not present

## 2021-09-03 DIAGNOSIS — E1165 Type 2 diabetes mellitus with hyperglycemia: Secondary | ICD-10-CM | POA: Diagnosis not present

## 2021-09-04 DIAGNOSIS — D518 Other vitamin B12 deficiency anemias: Secondary | ICD-10-CM | POA: Diagnosis not present

## 2021-09-04 DIAGNOSIS — E038 Other specified hypothyroidism: Secondary | ICD-10-CM | POA: Diagnosis not present

## 2021-09-04 DIAGNOSIS — D508 Other iron deficiency anemias: Secondary | ICD-10-CM | POA: Diagnosis not present

## 2021-09-04 DIAGNOSIS — E559 Vitamin D deficiency, unspecified: Secondary | ICD-10-CM | POA: Diagnosis not present

## 2021-09-04 DIAGNOSIS — E119 Type 2 diabetes mellitus without complications: Secondary | ICD-10-CM | POA: Diagnosis not present

## 2021-09-04 DIAGNOSIS — E785 Hyperlipidemia, unspecified: Secondary | ICD-10-CM | POA: Diagnosis not present

## 2021-09-04 DIAGNOSIS — E1165 Type 2 diabetes mellitus with hyperglycemia: Secondary | ICD-10-CM | POA: Diagnosis not present

## 2021-09-19 DIAGNOSIS — F5102 Adjustment insomnia: Secondary | ICD-10-CM | POA: Diagnosis not present

## 2021-09-19 DIAGNOSIS — Z87820 Personal history of traumatic brain injury: Secondary | ICD-10-CM | POA: Diagnosis not present

## 2021-09-19 DIAGNOSIS — F419 Anxiety disorder, unspecified: Secondary | ICD-10-CM | POA: Diagnosis not present

## 2021-10-01 DIAGNOSIS — E559 Vitamin D deficiency, unspecified: Secondary | ICD-10-CM | POA: Diagnosis not present

## 2021-10-01 DIAGNOSIS — D518 Other vitamin B12 deficiency anemias: Secondary | ICD-10-CM | POA: Diagnosis not present

## 2021-10-01 DIAGNOSIS — E1165 Type 2 diabetes mellitus with hyperglycemia: Secondary | ICD-10-CM | POA: Diagnosis not present

## 2021-10-01 DIAGNOSIS — E119 Type 2 diabetes mellitus without complications: Secondary | ICD-10-CM | POA: Diagnosis not present

## 2021-10-01 DIAGNOSIS — E038 Other specified hypothyroidism: Secondary | ICD-10-CM | POA: Diagnosis not present

## 2021-10-01 DIAGNOSIS — E785 Hyperlipidemia, unspecified: Secondary | ICD-10-CM | POA: Diagnosis not present

## 2021-10-01 DIAGNOSIS — D508 Other iron deficiency anemias: Secondary | ICD-10-CM | POA: Diagnosis not present

## 2021-10-02 DIAGNOSIS — I10 Essential (primary) hypertension: Secondary | ICD-10-CM | POA: Diagnosis not present

## 2021-10-06 DIAGNOSIS — E1165 Type 2 diabetes mellitus with hyperglycemia: Secondary | ICD-10-CM | POA: Diagnosis not present

## 2021-10-23 DIAGNOSIS — D508 Other iron deficiency anemias: Secondary | ICD-10-CM | POA: Diagnosis not present

## 2021-10-23 DIAGNOSIS — H538 Other visual disturbances: Secondary | ICD-10-CM | POA: Diagnosis not present

## 2021-10-23 DIAGNOSIS — K219 Gastro-esophageal reflux disease without esophagitis: Secondary | ICD-10-CM | POA: Diagnosis not present

## 2021-10-23 DIAGNOSIS — E1140 Type 2 diabetes mellitus with diabetic neuropathy, unspecified: Secondary | ICD-10-CM | POA: Diagnosis not present

## 2021-10-23 DIAGNOSIS — F411 Generalized anxiety disorder: Secondary | ICD-10-CM | POA: Diagnosis not present

## 2021-10-23 DIAGNOSIS — E1165 Type 2 diabetes mellitus with hyperglycemia: Secondary | ICD-10-CM | POA: Diagnosis not present

## 2021-10-23 DIAGNOSIS — K861 Other chronic pancreatitis: Secondary | ICD-10-CM | POA: Diagnosis not present

## 2021-10-24 DIAGNOSIS — F5102 Adjustment insomnia: Secondary | ICD-10-CM | POA: Diagnosis not present

## 2021-10-24 DIAGNOSIS — Z87820 Personal history of traumatic brain injury: Secondary | ICD-10-CM | POA: Diagnosis not present

## 2021-10-24 DIAGNOSIS — F419 Anxiety disorder, unspecified: Secondary | ICD-10-CM | POA: Diagnosis not present

## 2021-10-24 DIAGNOSIS — N390 Urinary tract infection, site not specified: Secondary | ICD-10-CM | POA: Diagnosis not present

## 2021-10-31 DIAGNOSIS — E119 Type 2 diabetes mellitus without complications: Secondary | ICD-10-CM | POA: Diagnosis not present

## 2021-10-31 DIAGNOSIS — E038 Other specified hypothyroidism: Secondary | ICD-10-CM | POA: Diagnosis not present

## 2021-10-31 DIAGNOSIS — M2041 Other hammer toe(s) (acquired), right foot: Secondary | ICD-10-CM | POA: Diagnosis not present

## 2021-10-31 DIAGNOSIS — L600 Ingrowing nail: Secondary | ICD-10-CM | POA: Diagnosis not present

## 2021-10-31 DIAGNOSIS — E559 Vitamin D deficiency, unspecified: Secondary | ICD-10-CM | POA: Diagnosis not present

## 2021-10-31 DIAGNOSIS — B351 Tinea unguium: Secondary | ICD-10-CM | POA: Diagnosis not present

## 2021-10-31 DIAGNOSIS — M79672 Pain in left foot: Secondary | ICD-10-CM | POA: Diagnosis not present

## 2021-10-31 DIAGNOSIS — E785 Hyperlipidemia, unspecified: Secondary | ICD-10-CM | POA: Diagnosis not present

## 2021-10-31 DIAGNOSIS — E1140 Type 2 diabetes mellitus with diabetic neuropathy, unspecified: Secondary | ICD-10-CM | POA: Diagnosis not present

## 2021-10-31 DIAGNOSIS — M2010 Hallux valgus (acquired), unspecified foot: Secondary | ICD-10-CM | POA: Diagnosis not present

## 2021-10-31 DIAGNOSIS — E1165 Type 2 diabetes mellitus with hyperglycemia: Secondary | ICD-10-CM | POA: Diagnosis not present

## 2021-10-31 DIAGNOSIS — D508 Other iron deficiency anemias: Secondary | ICD-10-CM | POA: Diagnosis not present

## 2021-10-31 DIAGNOSIS — M79671 Pain in right foot: Secondary | ICD-10-CM | POA: Diagnosis not present

## 2021-10-31 DIAGNOSIS — M2042 Other hammer toe(s) (acquired), left foot: Secondary | ICD-10-CM | POA: Diagnosis not present

## 2021-10-31 DIAGNOSIS — D518 Other vitamin B12 deficiency anemias: Secondary | ICD-10-CM | POA: Diagnosis not present

## 2021-11-02 DIAGNOSIS — I10 Essential (primary) hypertension: Secondary | ICD-10-CM | POA: Diagnosis not present

## 2021-11-14 DIAGNOSIS — F5102 Adjustment insomnia: Secondary | ICD-10-CM | POA: Diagnosis not present

## 2021-11-14 DIAGNOSIS — Z87820 Personal history of traumatic brain injury: Secondary | ICD-10-CM | POA: Diagnosis not present

## 2021-11-14 DIAGNOSIS — F419 Anxiety disorder, unspecified: Secondary | ICD-10-CM | POA: Diagnosis not present

## 2021-11-20 DIAGNOSIS — F32A Depression, unspecified: Secondary | ICD-10-CM | POA: Diagnosis not present

## 2021-11-20 DIAGNOSIS — F419 Anxiety disorder, unspecified: Secondary | ICD-10-CM | POA: Diagnosis not present

## 2021-11-20 DIAGNOSIS — K219 Gastro-esophageal reflux disease without esophagitis: Secondary | ICD-10-CM | POA: Diagnosis not present

## 2021-11-20 DIAGNOSIS — Z87820 Personal history of traumatic brain injury: Secondary | ICD-10-CM | POA: Diagnosis not present

## 2021-11-20 DIAGNOSIS — D508 Other iron deficiency anemias: Secondary | ICD-10-CM | POA: Diagnosis not present

## 2021-11-20 DIAGNOSIS — F5102 Adjustment insomnia: Secondary | ICD-10-CM | POA: Diagnosis not present

## 2021-11-20 DIAGNOSIS — E1165 Type 2 diabetes mellitus with hyperglycemia: Secondary | ICD-10-CM | POA: Diagnosis not present

## 2021-11-20 DIAGNOSIS — Z794 Long term (current) use of insulin: Secondary | ICD-10-CM | POA: Diagnosis not present

## 2021-11-20 DIAGNOSIS — E785 Hyperlipidemia, unspecified: Secondary | ICD-10-CM | POA: Diagnosis not present

## 2021-11-30 DIAGNOSIS — E038 Other specified hypothyroidism: Secondary | ICD-10-CM | POA: Diagnosis not present

## 2021-11-30 DIAGNOSIS — E1165 Type 2 diabetes mellitus with hyperglycemia: Secondary | ICD-10-CM | POA: Diagnosis not present

## 2021-11-30 DIAGNOSIS — D508 Other iron deficiency anemias: Secondary | ICD-10-CM | POA: Diagnosis not present

## 2021-11-30 DIAGNOSIS — D518 Other vitamin B12 deficiency anemias: Secondary | ICD-10-CM | POA: Diagnosis not present

## 2021-11-30 DIAGNOSIS — E119 Type 2 diabetes mellitus without complications: Secondary | ICD-10-CM | POA: Diagnosis not present

## 2021-11-30 DIAGNOSIS — E785 Hyperlipidemia, unspecified: Secondary | ICD-10-CM | POA: Diagnosis not present

## 2021-11-30 DIAGNOSIS — E1140 Type 2 diabetes mellitus with diabetic neuropathy, unspecified: Secondary | ICD-10-CM | POA: Diagnosis not present

## 2021-11-30 DIAGNOSIS — E559 Vitamin D deficiency, unspecified: Secondary | ICD-10-CM | POA: Diagnosis not present

## 2021-12-01 DIAGNOSIS — F411 Generalized anxiety disorder: Secondary | ICD-10-CM | POA: Diagnosis not present

## 2021-12-01 DIAGNOSIS — K219 Gastro-esophageal reflux disease without esophagitis: Secondary | ICD-10-CM | POA: Diagnosis not present

## 2021-12-01 DIAGNOSIS — Z0000 Encounter for general adult medical examination without abnormal findings: Secondary | ICD-10-CM | POA: Diagnosis not present

## 2021-12-01 DIAGNOSIS — E785 Hyperlipidemia, unspecified: Secondary | ICD-10-CM | POA: Diagnosis not present

## 2021-12-01 DIAGNOSIS — D508 Other iron deficiency anemias: Secondary | ICD-10-CM | POA: Diagnosis not present

## 2021-12-01 DIAGNOSIS — E1140 Type 2 diabetes mellitus with diabetic neuropathy, unspecified: Secondary | ICD-10-CM | POA: Diagnosis not present

## 2021-12-02 DIAGNOSIS — I10 Essential (primary) hypertension: Secondary | ICD-10-CM | POA: Diagnosis not present

## 2021-12-05 DIAGNOSIS — E7849 Other hyperlipidemia: Secondary | ICD-10-CM | POA: Diagnosis not present

## 2021-12-05 DIAGNOSIS — Z79899 Other long term (current) drug therapy: Secondary | ICD-10-CM | POA: Diagnosis not present

## 2021-12-05 DIAGNOSIS — E119 Type 2 diabetes mellitus without complications: Secondary | ICD-10-CM | POA: Diagnosis not present

## 2021-12-05 DIAGNOSIS — E038 Other specified hypothyroidism: Secondary | ICD-10-CM | POA: Diagnosis not present

## 2021-12-05 DIAGNOSIS — D518 Other vitamin B12 deficiency anemias: Secondary | ICD-10-CM | POA: Diagnosis not present

## 2021-12-05 DIAGNOSIS — E559 Vitamin D deficiency, unspecified: Secondary | ICD-10-CM | POA: Diagnosis not present

## 2021-12-19 DIAGNOSIS — F419 Anxiety disorder, unspecified: Secondary | ICD-10-CM | POA: Diagnosis not present

## 2021-12-19 DIAGNOSIS — F5102 Adjustment insomnia: Secondary | ICD-10-CM | POA: Diagnosis not present

## 2021-12-19 DIAGNOSIS — Z87820 Personal history of traumatic brain injury: Secondary | ICD-10-CM | POA: Diagnosis not present

## 2021-12-25 DIAGNOSIS — D508 Other iron deficiency anemias: Secondary | ICD-10-CM | POA: Diagnosis not present

## 2021-12-25 DIAGNOSIS — E1165 Type 2 diabetes mellitus with hyperglycemia: Secondary | ICD-10-CM | POA: Diagnosis not present

## 2021-12-25 DIAGNOSIS — K219 Gastro-esophageal reflux disease without esophagitis: Secondary | ICD-10-CM | POA: Diagnosis not present

## 2021-12-25 DIAGNOSIS — Z794 Long term (current) use of insulin: Secondary | ICD-10-CM | POA: Diagnosis not present

## 2021-12-25 DIAGNOSIS — E785 Hyperlipidemia, unspecified: Secondary | ICD-10-CM | POA: Diagnosis not present

## 2022-01-02 DIAGNOSIS — E1165 Type 2 diabetes mellitus with hyperglycemia: Secondary | ICD-10-CM | POA: Diagnosis not present

## 2022-01-04 DIAGNOSIS — E119 Type 2 diabetes mellitus without complications: Secondary | ICD-10-CM | POA: Diagnosis not present

## 2022-01-04 DIAGNOSIS — E038 Other specified hypothyroidism: Secondary | ICD-10-CM | POA: Diagnosis not present

## 2022-01-04 DIAGNOSIS — E1140 Type 2 diabetes mellitus with diabetic neuropathy, unspecified: Secondary | ICD-10-CM | POA: Diagnosis not present

## 2022-01-04 DIAGNOSIS — D508 Other iron deficiency anemias: Secondary | ICD-10-CM | POA: Diagnosis not present

## 2022-01-04 DIAGNOSIS — E559 Vitamin D deficiency, unspecified: Secondary | ICD-10-CM | POA: Diagnosis not present

## 2022-01-04 DIAGNOSIS — E785 Hyperlipidemia, unspecified: Secondary | ICD-10-CM | POA: Diagnosis not present

## 2022-01-04 DIAGNOSIS — D518 Other vitamin B12 deficiency anemias: Secondary | ICD-10-CM | POA: Diagnosis not present

## 2022-01-04 DIAGNOSIS — E1165 Type 2 diabetes mellitus with hyperglycemia: Secondary | ICD-10-CM | POA: Diagnosis not present

## 2022-01-16 DIAGNOSIS — Z87820 Personal history of traumatic brain injury: Secondary | ICD-10-CM | POA: Diagnosis not present

## 2022-01-16 DIAGNOSIS — F419 Anxiety disorder, unspecified: Secondary | ICD-10-CM | POA: Diagnosis not present

## 2022-01-16 DIAGNOSIS — F5102 Adjustment insomnia: Secondary | ICD-10-CM | POA: Diagnosis not present

## 2022-01-29 DIAGNOSIS — K219 Gastro-esophageal reflux disease without esophagitis: Secondary | ICD-10-CM | POA: Diagnosis not present

## 2022-01-29 DIAGNOSIS — K861 Other chronic pancreatitis: Secondary | ICD-10-CM | POA: Diagnosis not present

## 2022-01-29 DIAGNOSIS — E785 Hyperlipidemia, unspecified: Secondary | ICD-10-CM | POA: Diagnosis not present

## 2022-01-29 DIAGNOSIS — E1165 Type 2 diabetes mellitus with hyperglycemia: Secondary | ICD-10-CM | POA: Diagnosis not present

## 2022-01-29 DIAGNOSIS — Z794 Long term (current) use of insulin: Secondary | ICD-10-CM | POA: Diagnosis not present

## 2022-01-30 DIAGNOSIS — M79671 Pain in right foot: Secondary | ICD-10-CM | POA: Diagnosis not present

## 2022-01-30 DIAGNOSIS — E1140 Type 2 diabetes mellitus with diabetic neuropathy, unspecified: Secondary | ICD-10-CM | POA: Diagnosis not present

## 2022-01-30 DIAGNOSIS — M79672 Pain in left foot: Secondary | ICD-10-CM | POA: Diagnosis not present

## 2022-01-30 DIAGNOSIS — L600 Ingrowing nail: Secondary | ICD-10-CM | POA: Diagnosis not present

## 2022-01-30 DIAGNOSIS — M2011 Hallux valgus (acquired), right foot: Secondary | ICD-10-CM | POA: Diagnosis not present

## 2022-01-30 DIAGNOSIS — M2042 Other hammer toe(s) (acquired), left foot: Secondary | ICD-10-CM | POA: Diagnosis not present

## 2022-01-30 DIAGNOSIS — M2041 Other hammer toe(s) (acquired), right foot: Secondary | ICD-10-CM | POA: Diagnosis not present

## 2022-01-30 DIAGNOSIS — M2012 Hallux valgus (acquired), left foot: Secondary | ICD-10-CM | POA: Diagnosis not present

## 2022-01-30 DIAGNOSIS — B351 Tinea unguium: Secondary | ICD-10-CM | POA: Diagnosis not present

## 2022-02-01 DIAGNOSIS — I10 Essential (primary) hypertension: Secondary | ICD-10-CM | POA: Diagnosis not present

## 2022-02-04 DIAGNOSIS — E559 Vitamin D deficiency, unspecified: Secondary | ICD-10-CM | POA: Diagnosis not present

## 2022-02-04 DIAGNOSIS — E785 Hyperlipidemia, unspecified: Secondary | ICD-10-CM | POA: Diagnosis not present

## 2022-02-04 DIAGNOSIS — E1140 Type 2 diabetes mellitus with diabetic neuropathy, unspecified: Secondary | ICD-10-CM | POA: Diagnosis not present

## 2022-02-04 DIAGNOSIS — D508 Other iron deficiency anemias: Secondary | ICD-10-CM | POA: Diagnosis not present

## 2022-02-04 DIAGNOSIS — D518 Other vitamin B12 deficiency anemias: Secondary | ICD-10-CM | POA: Diagnosis not present

## 2022-02-04 DIAGNOSIS — E1165 Type 2 diabetes mellitus with hyperglycemia: Secondary | ICD-10-CM | POA: Diagnosis not present

## 2022-02-04 DIAGNOSIS — E119 Type 2 diabetes mellitus without complications: Secondary | ICD-10-CM | POA: Diagnosis not present

## 2022-02-04 DIAGNOSIS — E038 Other specified hypothyroidism: Secondary | ICD-10-CM | POA: Diagnosis not present

## 2022-02-13 DIAGNOSIS — F419 Anxiety disorder, unspecified: Secondary | ICD-10-CM | POA: Diagnosis not present

## 2022-02-13 DIAGNOSIS — F5102 Adjustment insomnia: Secondary | ICD-10-CM | POA: Diagnosis not present

## 2022-02-13 DIAGNOSIS — Z87820 Personal history of traumatic brain injury: Secondary | ICD-10-CM | POA: Diagnosis not present

## 2022-03-04 DIAGNOSIS — E1165 Type 2 diabetes mellitus with hyperglycemia: Secondary | ICD-10-CM | POA: Diagnosis not present

## 2022-03-05 DIAGNOSIS — E785 Hyperlipidemia, unspecified: Secondary | ICD-10-CM | POA: Diagnosis not present

## 2022-03-05 DIAGNOSIS — E1165 Type 2 diabetes mellitus with hyperglycemia: Secondary | ICD-10-CM | POA: Diagnosis not present

## 2022-03-05 DIAGNOSIS — K861 Other chronic pancreatitis: Secondary | ICD-10-CM | POA: Diagnosis not present

## 2022-03-05 DIAGNOSIS — K219 Gastro-esophageal reflux disease without esophagitis: Secondary | ICD-10-CM | POA: Diagnosis not present

## 2022-03-07 DIAGNOSIS — D518 Other vitamin B12 deficiency anemias: Secondary | ICD-10-CM | POA: Diagnosis not present

## 2022-03-07 DIAGNOSIS — E038 Other specified hypothyroidism: Secondary | ICD-10-CM | POA: Diagnosis not present

## 2022-03-07 DIAGNOSIS — E119 Type 2 diabetes mellitus without complications: Secondary | ICD-10-CM | POA: Diagnosis not present

## 2022-03-07 DIAGNOSIS — D508 Other iron deficiency anemias: Secondary | ICD-10-CM | POA: Diagnosis not present

## 2022-03-07 DIAGNOSIS — E559 Vitamin D deficiency, unspecified: Secondary | ICD-10-CM | POA: Diagnosis not present

## 2022-03-07 DIAGNOSIS — E1165 Type 2 diabetes mellitus with hyperglycemia: Secondary | ICD-10-CM | POA: Diagnosis not present

## 2022-03-07 DIAGNOSIS — E1140 Type 2 diabetes mellitus with diabetic neuropathy, unspecified: Secondary | ICD-10-CM | POA: Diagnosis not present

## 2022-03-07 DIAGNOSIS — E785 Hyperlipidemia, unspecified: Secondary | ICD-10-CM | POA: Diagnosis not present

## 2022-03-20 DIAGNOSIS — F419 Anxiety disorder, unspecified: Secondary | ICD-10-CM | POA: Diagnosis not present

## 2022-03-20 DIAGNOSIS — E038 Other specified hypothyroidism: Secondary | ICD-10-CM | POA: Diagnosis not present

## 2022-03-20 DIAGNOSIS — E559 Vitamin D deficiency, unspecified: Secondary | ICD-10-CM | POA: Diagnosis not present

## 2022-03-20 DIAGNOSIS — E119 Type 2 diabetes mellitus without complications: Secondary | ICD-10-CM | POA: Diagnosis not present

## 2022-03-20 DIAGNOSIS — N390 Urinary tract infection, site not specified: Secondary | ICD-10-CM | POA: Diagnosis not present

## 2022-03-20 DIAGNOSIS — Z79899 Other long term (current) drug therapy: Secondary | ICD-10-CM | POA: Diagnosis not present

## 2022-03-20 DIAGNOSIS — E782 Mixed hyperlipidemia: Secondary | ICD-10-CM | POA: Diagnosis not present

## 2022-03-20 DIAGNOSIS — D518 Other vitamin B12 deficiency anemias: Secondary | ICD-10-CM | POA: Diagnosis not present

## 2022-03-20 DIAGNOSIS — F5102 Adjustment insomnia: Secondary | ICD-10-CM | POA: Diagnosis not present

## 2022-03-20 DIAGNOSIS — Z87820 Personal history of traumatic brain injury: Secondary | ICD-10-CM | POA: Diagnosis not present

## 2022-03-26 DIAGNOSIS — E1165 Type 2 diabetes mellitus with hyperglycemia: Secondary | ICD-10-CM | POA: Diagnosis not present

## 2022-03-26 DIAGNOSIS — F5105 Insomnia due to other mental disorder: Secondary | ICD-10-CM | POA: Diagnosis not present

## 2022-03-26 DIAGNOSIS — Z794 Long term (current) use of insulin: Secondary | ICD-10-CM | POA: Diagnosis not present

## 2022-03-26 DIAGNOSIS — E785 Hyperlipidemia, unspecified: Secondary | ICD-10-CM | POA: Diagnosis not present

## 2022-03-26 DIAGNOSIS — K219 Gastro-esophageal reflux disease without esophagitis: Secondary | ICD-10-CM | POA: Diagnosis not present

## 2022-03-26 DIAGNOSIS — F419 Anxiety disorder, unspecified: Secondary | ICD-10-CM | POA: Diagnosis not present

## 2022-03-26 DIAGNOSIS — Z87820 Personal history of traumatic brain injury: Secondary | ICD-10-CM | POA: Diagnosis not present

## 2022-03-26 DIAGNOSIS — D508 Other iron deficiency anemias: Secondary | ICD-10-CM | POA: Diagnosis not present

## 2022-04-04 DIAGNOSIS — E1165 Type 2 diabetes mellitus with hyperglycemia: Secondary | ICD-10-CM | POA: Diagnosis not present

## 2022-04-06 DIAGNOSIS — E1165 Type 2 diabetes mellitus with hyperglycemia: Secondary | ICD-10-CM | POA: Diagnosis not present

## 2022-04-06 DIAGNOSIS — E1140 Type 2 diabetes mellitus with diabetic neuropathy, unspecified: Secondary | ICD-10-CM | POA: Diagnosis not present

## 2022-04-06 DIAGNOSIS — E559 Vitamin D deficiency, unspecified: Secondary | ICD-10-CM | POA: Diagnosis not present

## 2022-04-06 DIAGNOSIS — D518 Other vitamin B12 deficiency anemias: Secondary | ICD-10-CM | POA: Diagnosis not present

## 2022-04-06 DIAGNOSIS — E119 Type 2 diabetes mellitus without complications: Secondary | ICD-10-CM | POA: Diagnosis not present

## 2022-04-06 DIAGNOSIS — E782 Mixed hyperlipidemia: Secondary | ICD-10-CM | POA: Diagnosis not present

## 2022-04-06 DIAGNOSIS — E038 Other specified hypothyroidism: Secondary | ICD-10-CM | POA: Diagnosis not present

## 2022-04-06 DIAGNOSIS — D508 Other iron deficiency anemias: Secondary | ICD-10-CM | POA: Diagnosis not present

## 2022-04-06 DIAGNOSIS — E785 Hyperlipidemia, unspecified: Secondary | ICD-10-CM | POA: Diagnosis not present

## 2022-04-23 DIAGNOSIS — F411 Generalized anxiety disorder: Secondary | ICD-10-CM | POA: Diagnosis not present

## 2022-04-23 DIAGNOSIS — G40909 Epilepsy, unspecified, not intractable, without status epilepticus: Secondary | ICD-10-CM | POA: Diagnosis not present

## 2022-04-23 DIAGNOSIS — E1140 Type 2 diabetes mellitus with diabetic neuropathy, unspecified: Secondary | ICD-10-CM | POA: Diagnosis not present

## 2022-04-29 DIAGNOSIS — F5102 Adjustment insomnia: Secondary | ICD-10-CM | POA: Diagnosis not present

## 2022-04-29 DIAGNOSIS — F5105 Insomnia due to other mental disorder: Secondary | ICD-10-CM | POA: Diagnosis not present

## 2022-04-29 DIAGNOSIS — F419 Anxiety disorder, unspecified: Secondary | ICD-10-CM | POA: Diagnosis not present

## 2022-04-29 DIAGNOSIS — Z87820 Personal history of traumatic brain injury: Secondary | ICD-10-CM | POA: Diagnosis not present

## 2022-05-03 DIAGNOSIS — M79671 Pain in right foot: Secondary | ICD-10-CM | POA: Diagnosis not present

## 2022-05-03 DIAGNOSIS — M2042 Other hammer toe(s) (acquired), left foot: Secondary | ICD-10-CM | POA: Diagnosis not present

## 2022-05-03 DIAGNOSIS — B351 Tinea unguium: Secondary | ICD-10-CM | POA: Diagnosis not present

## 2022-05-03 DIAGNOSIS — M2011 Hallux valgus (acquired), right foot: Secondary | ICD-10-CM | POA: Diagnosis not present

## 2022-05-03 DIAGNOSIS — M79672 Pain in left foot: Secondary | ICD-10-CM | POA: Diagnosis not present

## 2022-05-03 DIAGNOSIS — M2012 Hallux valgus (acquired), left foot: Secondary | ICD-10-CM | POA: Diagnosis not present

## 2022-05-03 DIAGNOSIS — L600 Ingrowing nail: Secondary | ICD-10-CM | POA: Diagnosis not present

## 2022-05-03 DIAGNOSIS — E1140 Type 2 diabetes mellitus with diabetic neuropathy, unspecified: Secondary | ICD-10-CM | POA: Diagnosis not present

## 2022-05-03 DIAGNOSIS — M2041 Other hammer toe(s) (acquired), right foot: Secondary | ICD-10-CM | POA: Diagnosis not present

## 2022-05-04 DIAGNOSIS — E1165 Type 2 diabetes mellitus with hyperglycemia: Secondary | ICD-10-CM | POA: Diagnosis not present

## 2022-05-06 DIAGNOSIS — D518 Other vitamin B12 deficiency anemias: Secondary | ICD-10-CM | POA: Diagnosis not present

## 2022-05-06 DIAGNOSIS — D508 Other iron deficiency anemias: Secondary | ICD-10-CM | POA: Diagnosis not present

## 2022-05-06 DIAGNOSIS — E782 Mixed hyperlipidemia: Secondary | ICD-10-CM | POA: Diagnosis not present

## 2022-05-06 DIAGNOSIS — E038 Other specified hypothyroidism: Secondary | ICD-10-CM | POA: Diagnosis not present

## 2022-05-06 DIAGNOSIS — E1165 Type 2 diabetes mellitus with hyperglycemia: Secondary | ICD-10-CM | POA: Diagnosis not present

## 2022-05-06 DIAGNOSIS — E119 Type 2 diabetes mellitus without complications: Secondary | ICD-10-CM | POA: Diagnosis not present

## 2022-05-06 DIAGNOSIS — E559 Vitamin D deficiency, unspecified: Secondary | ICD-10-CM | POA: Diagnosis not present

## 2022-05-06 DIAGNOSIS — E785 Hyperlipidemia, unspecified: Secondary | ICD-10-CM | POA: Diagnosis not present

## 2022-05-06 DIAGNOSIS — E1140 Type 2 diabetes mellitus with diabetic neuropathy, unspecified: Secondary | ICD-10-CM | POA: Diagnosis not present

## 2022-05-28 DIAGNOSIS — D508 Other iron deficiency anemias: Secondary | ICD-10-CM | POA: Diagnosis not present

## 2022-05-28 DIAGNOSIS — F32A Depression, unspecified: Secondary | ICD-10-CM | POA: Diagnosis not present

## 2022-05-28 DIAGNOSIS — E785 Hyperlipidemia, unspecified: Secondary | ICD-10-CM | POA: Diagnosis not present

## 2022-05-28 DIAGNOSIS — E1165 Type 2 diabetes mellitus with hyperglycemia: Secondary | ICD-10-CM | POA: Diagnosis not present

## 2022-05-28 DIAGNOSIS — K219 Gastro-esophageal reflux disease without esophagitis: Secondary | ICD-10-CM | POA: Diagnosis not present

## 2022-05-29 DIAGNOSIS — F419 Anxiety disorder, unspecified: Secondary | ICD-10-CM | POA: Diagnosis not present

## 2022-05-29 DIAGNOSIS — Z87820 Personal history of traumatic brain injury: Secondary | ICD-10-CM | POA: Diagnosis not present

## 2022-05-29 DIAGNOSIS — F5102 Adjustment insomnia: Secondary | ICD-10-CM | POA: Diagnosis not present

## 2022-05-29 DIAGNOSIS — F5105 Insomnia due to other mental disorder: Secondary | ICD-10-CM | POA: Diagnosis not present

## 2022-05-30 DIAGNOSIS — R296 Repeated falls: Secondary | ICD-10-CM | POA: Diagnosis not present

## 2022-06-01 DIAGNOSIS — E038 Other specified hypothyroidism: Secondary | ICD-10-CM | POA: Diagnosis not present

## 2022-06-01 DIAGNOSIS — E785 Hyperlipidemia, unspecified: Secondary | ICD-10-CM | POA: Diagnosis not present

## 2022-06-01 DIAGNOSIS — E119 Type 2 diabetes mellitus without complications: Secondary | ICD-10-CM | POA: Diagnosis not present

## 2022-06-01 DIAGNOSIS — E1165 Type 2 diabetes mellitus with hyperglycemia: Secondary | ICD-10-CM | POA: Diagnosis not present

## 2022-06-01 DIAGNOSIS — D518 Other vitamin B12 deficiency anemias: Secondary | ICD-10-CM | POA: Diagnosis not present

## 2022-06-01 DIAGNOSIS — D508 Other iron deficiency anemias: Secondary | ICD-10-CM | POA: Diagnosis not present

## 2022-06-01 DIAGNOSIS — E559 Vitamin D deficiency, unspecified: Secondary | ICD-10-CM | POA: Diagnosis not present

## 2022-06-01 DIAGNOSIS — E1140 Type 2 diabetes mellitus with diabetic neuropathy, unspecified: Secondary | ICD-10-CM | POA: Diagnosis not present

## 2022-06-01 DIAGNOSIS — E782 Mixed hyperlipidemia: Secondary | ICD-10-CM | POA: Diagnosis not present

## 2022-06-04 DIAGNOSIS — E1165 Type 2 diabetes mellitus with hyperglycemia: Secondary | ICD-10-CM | POA: Diagnosis not present

## 2022-06-19 DIAGNOSIS — F419 Anxiety disorder, unspecified: Secondary | ICD-10-CM | POA: Diagnosis not present

## 2022-06-19 DIAGNOSIS — F5102 Adjustment insomnia: Secondary | ICD-10-CM | POA: Diagnosis not present

## 2022-06-19 DIAGNOSIS — Z87820 Personal history of traumatic brain injury: Secondary | ICD-10-CM | POA: Diagnosis not present

## 2022-06-25 DIAGNOSIS — F32A Depression, unspecified: Secondary | ICD-10-CM | POA: Diagnosis not present

## 2022-06-25 DIAGNOSIS — K219 Gastro-esophageal reflux disease without esophagitis: Secondary | ICD-10-CM | POA: Diagnosis not present

## 2022-06-25 DIAGNOSIS — E785 Hyperlipidemia, unspecified: Secondary | ICD-10-CM | POA: Diagnosis not present

## 2022-06-25 DIAGNOSIS — D508 Other iron deficiency anemias: Secondary | ICD-10-CM | POA: Diagnosis not present

## 2022-06-25 DIAGNOSIS — E1165 Type 2 diabetes mellitus with hyperglycemia: Secondary | ICD-10-CM | POA: Diagnosis not present

## 2022-06-26 DIAGNOSIS — D518 Other vitamin B12 deficiency anemias: Secondary | ICD-10-CM | POA: Diagnosis not present

## 2022-06-26 DIAGNOSIS — E119 Type 2 diabetes mellitus without complications: Secondary | ICD-10-CM | POA: Diagnosis not present

## 2022-06-26 DIAGNOSIS — Z79899 Other long term (current) drug therapy: Secondary | ICD-10-CM | POA: Diagnosis not present

## 2022-06-26 DIAGNOSIS — E038 Other specified hypothyroidism: Secondary | ICD-10-CM | POA: Diagnosis not present

## 2022-06-26 DIAGNOSIS — E782 Mixed hyperlipidemia: Secondary | ICD-10-CM | POA: Diagnosis not present

## 2022-07-04 DIAGNOSIS — E1165 Type 2 diabetes mellitus with hyperglycemia: Secondary | ICD-10-CM | POA: Diagnosis not present

## 2022-07-07 DIAGNOSIS — D508 Other iron deficiency anemias: Secondary | ICD-10-CM | POA: Diagnosis not present

## 2022-07-07 DIAGNOSIS — E782 Mixed hyperlipidemia: Secondary | ICD-10-CM | POA: Diagnosis not present

## 2022-07-07 DIAGNOSIS — E038 Other specified hypothyroidism: Secondary | ICD-10-CM | POA: Diagnosis not present

## 2022-07-07 DIAGNOSIS — D518 Other vitamin B12 deficiency anemias: Secondary | ICD-10-CM | POA: Diagnosis not present

## 2022-07-07 DIAGNOSIS — E119 Type 2 diabetes mellitus without complications: Secondary | ICD-10-CM | POA: Diagnosis not present

## 2022-07-07 DIAGNOSIS — E785 Hyperlipidemia, unspecified: Secondary | ICD-10-CM | POA: Diagnosis not present

## 2022-07-07 DIAGNOSIS — E1165 Type 2 diabetes mellitus with hyperglycemia: Secondary | ICD-10-CM | POA: Diagnosis not present

## 2022-07-07 DIAGNOSIS — E1140 Type 2 diabetes mellitus with diabetic neuropathy, unspecified: Secondary | ICD-10-CM | POA: Diagnosis not present

## 2022-07-07 DIAGNOSIS — E559 Vitamin D deficiency, unspecified: Secondary | ICD-10-CM | POA: Diagnosis not present

## 2022-07-16 DIAGNOSIS — R011 Cardiac murmur, unspecified: Secondary | ICD-10-CM | POA: Diagnosis not present

## 2022-07-23 DIAGNOSIS — E119 Type 2 diabetes mellitus without complications: Secondary | ICD-10-CM | POA: Diagnosis not present

## 2022-07-23 DIAGNOSIS — E785 Hyperlipidemia, unspecified: Secondary | ICD-10-CM | POA: Diagnosis not present

## 2022-07-23 DIAGNOSIS — F32A Depression, unspecified: Secondary | ICD-10-CM | POA: Diagnosis not present

## 2022-07-23 DIAGNOSIS — K219 Gastro-esophageal reflux disease without esophagitis: Secondary | ICD-10-CM | POA: Diagnosis not present

## 2022-07-24 DIAGNOSIS — I70223 Atherosclerosis of native arteries of extremities with rest pain, bilateral legs: Secondary | ICD-10-CM | POA: Diagnosis not present

## 2022-07-25 DIAGNOSIS — I77811 Abdominal aortic ectasia: Secondary | ICD-10-CM | POA: Diagnosis not present

## 2022-08-01 DIAGNOSIS — I6523 Occlusion and stenosis of bilateral carotid arteries: Secondary | ICD-10-CM | POA: Diagnosis not present

## 2022-08-01 DIAGNOSIS — R0989 Other specified symptoms and signs involving the circulatory and respiratory systems: Secondary | ICD-10-CM | POA: Diagnosis not present

## 2022-08-04 DIAGNOSIS — E1165 Type 2 diabetes mellitus with hyperglycemia: Secondary | ICD-10-CM | POA: Diagnosis not present

## 2022-08-05 DIAGNOSIS — G8384 Todd's paralysis (postepileptic): Secondary | ICD-10-CM | POA: Diagnosis not present

## 2022-08-05 DIAGNOSIS — E559 Vitamin D deficiency, unspecified: Secondary | ICD-10-CM | POA: Diagnosis not present

## 2022-08-05 DIAGNOSIS — Z882 Allergy status to sulfonamides status: Secondary | ICD-10-CM | POA: Diagnosis not present

## 2022-08-05 DIAGNOSIS — R413 Other amnesia: Secondary | ICD-10-CM | POA: Diagnosis not present

## 2022-08-05 DIAGNOSIS — D518 Other vitamin B12 deficiency anemias: Secondary | ICD-10-CM | POA: Diagnosis not present

## 2022-08-05 DIAGNOSIS — F17210 Nicotine dependence, cigarettes, uncomplicated: Secondary | ICD-10-CM | POA: Diagnosis not present

## 2022-08-05 DIAGNOSIS — R9431 Abnormal electrocardiogram [ECG] [EKG]: Secondary | ICD-10-CM | POA: Diagnosis not present

## 2022-08-05 DIAGNOSIS — R569 Unspecified convulsions: Secondary | ICD-10-CM | POA: Diagnosis not present

## 2022-08-05 DIAGNOSIS — K219 Gastro-esophageal reflux disease without esophagitis: Secondary | ICD-10-CM | POA: Diagnosis not present

## 2022-08-05 DIAGNOSIS — Z87820 Personal history of traumatic brain injury: Secondary | ICD-10-CM | POA: Diagnosis not present

## 2022-08-05 DIAGNOSIS — F419 Anxiety disorder, unspecified: Secondary | ICD-10-CM | POA: Diagnosis not present

## 2022-08-05 DIAGNOSIS — E785 Hyperlipidemia, unspecified: Secondary | ICD-10-CM | POA: Diagnosis not present

## 2022-08-05 DIAGNOSIS — Z79899 Other long term (current) drug therapy: Secondary | ICD-10-CM | POA: Diagnosis not present

## 2022-08-05 DIAGNOSIS — E7800 Pure hypercholesterolemia, unspecified: Secondary | ICD-10-CM | POA: Diagnosis not present

## 2022-08-05 DIAGNOSIS — Z1152 Encounter for screening for COVID-19: Secondary | ICD-10-CM | POA: Diagnosis not present

## 2022-08-05 DIAGNOSIS — S0181XA Laceration without foreign body of other part of head, initial encounter: Secondary | ICD-10-CM | POA: Diagnosis not present

## 2022-08-05 DIAGNOSIS — E038 Other specified hypothyroidism: Secondary | ICD-10-CM | POA: Diagnosis not present

## 2022-08-05 DIAGNOSIS — E119 Type 2 diabetes mellitus without complications: Secondary | ICD-10-CM | POA: Diagnosis not present

## 2022-08-05 DIAGNOSIS — W19XXXA Unspecified fall, initial encounter: Secondary | ICD-10-CM | POA: Diagnosis not present

## 2022-08-05 DIAGNOSIS — G40909 Epilepsy, unspecified, not intractable, without status epilepticus: Secondary | ICD-10-CM | POA: Diagnosis not present

## 2022-08-05 DIAGNOSIS — D508 Other iron deficiency anemias: Secondary | ICD-10-CM | POA: Diagnosis not present

## 2022-08-05 DIAGNOSIS — M542 Cervicalgia: Secondary | ICD-10-CM | POA: Diagnosis not present

## 2022-08-05 DIAGNOSIS — Z7984 Long term (current) use of oral hypoglycemic drugs: Secondary | ICD-10-CM | POA: Diagnosis not present

## 2022-08-05 DIAGNOSIS — R531 Weakness: Secondary | ICD-10-CM | POA: Diagnosis not present

## 2022-08-05 DIAGNOSIS — E782 Mixed hyperlipidemia: Secondary | ICD-10-CM | POA: Diagnosis not present

## 2022-08-05 DIAGNOSIS — Z794 Long term (current) use of insulin: Secondary | ICD-10-CM | POA: Diagnosis not present

## 2022-08-05 DIAGNOSIS — G319 Degenerative disease of nervous system, unspecified: Secondary | ICD-10-CM | POA: Diagnosis not present

## 2022-08-05 DIAGNOSIS — E1165 Type 2 diabetes mellitus with hyperglycemia: Secondary | ICD-10-CM | POA: Diagnosis not present

## 2022-08-05 DIAGNOSIS — E1140 Type 2 diabetes mellitus with diabetic neuropathy, unspecified: Secondary | ICD-10-CM | POA: Diagnosis not present

## 2022-08-05 DIAGNOSIS — Z7982 Long term (current) use of aspirin: Secondary | ICD-10-CM | POA: Diagnosis not present

## 2022-08-05 DIAGNOSIS — J449 Chronic obstructive pulmonary disease, unspecified: Secondary | ICD-10-CM | POA: Diagnosis not present

## 2022-08-07 DIAGNOSIS — D518 Other vitamin B12 deficiency anemias: Secondary | ICD-10-CM | POA: Diagnosis not present

## 2022-08-07 DIAGNOSIS — D508 Other iron deficiency anemias: Secondary | ICD-10-CM | POA: Diagnosis not present

## 2022-08-07 DIAGNOSIS — E038 Other specified hypothyroidism: Secondary | ICD-10-CM | POA: Diagnosis not present

## 2022-08-07 DIAGNOSIS — E1165 Type 2 diabetes mellitus with hyperglycemia: Secondary | ICD-10-CM | POA: Diagnosis not present

## 2022-08-07 DIAGNOSIS — E1140 Type 2 diabetes mellitus with diabetic neuropathy, unspecified: Secondary | ICD-10-CM | POA: Diagnosis not present

## 2022-08-07 DIAGNOSIS — E785 Hyperlipidemia, unspecified: Secondary | ICD-10-CM | POA: Diagnosis not present

## 2022-08-07 DIAGNOSIS — E119 Type 2 diabetes mellitus without complications: Secondary | ICD-10-CM | POA: Diagnosis not present

## 2022-08-07 DIAGNOSIS — E559 Vitamin D deficiency, unspecified: Secondary | ICD-10-CM | POA: Diagnosis not present

## 2022-08-07 DIAGNOSIS — E782 Mixed hyperlipidemia: Secondary | ICD-10-CM | POA: Diagnosis not present

## 2022-08-13 DIAGNOSIS — S069X0S Unspecified intracranial injury without loss of consciousness, sequela: Secondary | ICD-10-CM | POA: Diagnosis not present

## 2022-08-13 DIAGNOSIS — D508 Other iron deficiency anemias: Secondary | ICD-10-CM | POA: Diagnosis not present

## 2022-08-13 DIAGNOSIS — K219 Gastro-esophageal reflux disease without esophagitis: Secondary | ICD-10-CM | POA: Diagnosis not present

## 2022-08-13 DIAGNOSIS — E1140 Type 2 diabetes mellitus with diabetic neuropathy, unspecified: Secondary | ICD-10-CM | POA: Diagnosis not present

## 2022-08-14 DIAGNOSIS — L600 Ingrowing nail: Secondary | ICD-10-CM | POA: Diagnosis not present

## 2022-08-14 DIAGNOSIS — M79671 Pain in right foot: Secondary | ICD-10-CM | POA: Diagnosis not present

## 2022-08-14 DIAGNOSIS — B351 Tinea unguium: Secondary | ICD-10-CM | POA: Diagnosis not present

## 2022-08-14 DIAGNOSIS — E1140 Type 2 diabetes mellitus with diabetic neuropathy, unspecified: Secondary | ICD-10-CM | POA: Diagnosis not present

## 2022-08-14 DIAGNOSIS — M79672 Pain in left foot: Secondary | ICD-10-CM | POA: Diagnosis not present

## 2022-09-02 DIAGNOSIS — F419 Anxiety disorder, unspecified: Secondary | ICD-10-CM | POA: Diagnosis not present

## 2022-09-02 DIAGNOSIS — F5102 Adjustment insomnia: Secondary | ICD-10-CM | POA: Diagnosis not present

## 2022-09-02 DIAGNOSIS — Z87820 Personal history of traumatic brain injury: Secondary | ICD-10-CM | POA: Diagnosis not present

## 2022-09-04 DIAGNOSIS — D508 Other iron deficiency anemias: Secondary | ICD-10-CM | POA: Diagnosis not present

## 2022-09-04 DIAGNOSIS — E1140 Type 2 diabetes mellitus with diabetic neuropathy, unspecified: Secondary | ICD-10-CM | POA: Diagnosis not present

## 2022-09-04 DIAGNOSIS — E785 Hyperlipidemia, unspecified: Secondary | ICD-10-CM | POA: Diagnosis not present

## 2022-09-04 DIAGNOSIS — E1165 Type 2 diabetes mellitus with hyperglycemia: Secondary | ICD-10-CM | POA: Diagnosis not present

## 2022-09-04 DIAGNOSIS — E782 Mixed hyperlipidemia: Secondary | ICD-10-CM | POA: Diagnosis not present

## 2022-09-04 DIAGNOSIS — E119 Type 2 diabetes mellitus without complications: Secondary | ICD-10-CM | POA: Diagnosis not present

## 2022-09-04 DIAGNOSIS — E038 Other specified hypothyroidism: Secondary | ICD-10-CM | POA: Diagnosis not present

## 2022-09-04 DIAGNOSIS — E559 Vitamin D deficiency, unspecified: Secondary | ICD-10-CM | POA: Diagnosis not present

## 2022-09-04 DIAGNOSIS — D518 Other vitamin B12 deficiency anemias: Secondary | ICD-10-CM | POA: Diagnosis not present

## 2022-09-05 DIAGNOSIS — F411 Generalized anxiety disorder: Secondary | ICD-10-CM | POA: Diagnosis not present

## 2022-09-05 DIAGNOSIS — E1165 Type 2 diabetes mellitus with hyperglycemia: Secondary | ICD-10-CM | POA: Diagnosis not present

## 2022-09-05 DIAGNOSIS — F5105 Insomnia due to other mental disorder: Secondary | ICD-10-CM | POA: Diagnosis not present

## 2022-09-10 DIAGNOSIS — E1140 Type 2 diabetes mellitus with diabetic neuropathy, unspecified: Secondary | ICD-10-CM | POA: Diagnosis not present

## 2022-09-10 DIAGNOSIS — D508 Other iron deficiency anemias: Secondary | ICD-10-CM | POA: Diagnosis not present

## 2022-09-10 DIAGNOSIS — F32A Depression, unspecified: Secondary | ICD-10-CM | POA: Diagnosis not present

## 2022-09-10 DIAGNOSIS — K219 Gastro-esophageal reflux disease without esophagitis: Secondary | ICD-10-CM | POA: Diagnosis not present

## 2022-09-10 DIAGNOSIS — E785 Hyperlipidemia, unspecified: Secondary | ICD-10-CM | POA: Diagnosis not present

## 2022-09-25 DIAGNOSIS — E038 Other specified hypothyroidism: Secondary | ICD-10-CM | POA: Diagnosis not present

## 2022-09-25 DIAGNOSIS — Z79899 Other long term (current) drug therapy: Secondary | ICD-10-CM | POA: Diagnosis not present

## 2022-09-25 DIAGNOSIS — E119 Type 2 diabetes mellitus without complications: Secondary | ICD-10-CM | POA: Diagnosis not present

## 2022-09-25 DIAGNOSIS — D518 Other vitamin B12 deficiency anemias: Secondary | ICD-10-CM | POA: Diagnosis not present

## 2022-09-25 DIAGNOSIS — E782 Mixed hyperlipidemia: Secondary | ICD-10-CM | POA: Diagnosis not present

## 2022-10-02 DIAGNOSIS — E1165 Type 2 diabetes mellitus with hyperglycemia: Secondary | ICD-10-CM | POA: Diagnosis not present

## 2022-10-02 DIAGNOSIS — Z87820 Personal history of traumatic brain injury: Secondary | ICD-10-CM | POA: Diagnosis not present

## 2022-10-02 DIAGNOSIS — F411 Generalized anxiety disorder: Secondary | ICD-10-CM | POA: Diagnosis not present

## 2022-10-02 DIAGNOSIS — E782 Mixed hyperlipidemia: Secondary | ICD-10-CM | POA: Diagnosis not present

## 2022-10-02 DIAGNOSIS — E785 Hyperlipidemia, unspecified: Secondary | ICD-10-CM | POA: Diagnosis not present

## 2022-10-02 DIAGNOSIS — E119 Type 2 diabetes mellitus without complications: Secondary | ICD-10-CM | POA: Diagnosis not present

## 2022-10-02 DIAGNOSIS — E1140 Type 2 diabetes mellitus with diabetic neuropathy, unspecified: Secondary | ICD-10-CM | POA: Diagnosis not present

## 2022-10-02 DIAGNOSIS — F419 Anxiety disorder, unspecified: Secondary | ICD-10-CM | POA: Diagnosis not present

## 2022-10-02 DIAGNOSIS — E7849 Other hyperlipidemia: Secondary | ICD-10-CM | POA: Diagnosis not present

## 2022-10-02 DIAGNOSIS — E038 Other specified hypothyroidism: Secondary | ICD-10-CM | POA: Diagnosis not present

## 2022-10-02 DIAGNOSIS — I70223 Atherosclerosis of native arteries of extremities with rest pain, bilateral legs: Secondary | ICD-10-CM | POA: Diagnosis not present

## 2022-10-02 DIAGNOSIS — F5102 Adjustment insomnia: Secondary | ICD-10-CM | POA: Diagnosis not present

## 2022-10-02 DIAGNOSIS — D518 Other vitamin B12 deficiency anemias: Secondary | ICD-10-CM | POA: Diagnosis not present

## 2022-10-02 DIAGNOSIS — E559 Vitamin D deficiency, unspecified: Secondary | ICD-10-CM | POA: Diagnosis not present

## 2022-10-02 DIAGNOSIS — D508 Other iron deficiency anemias: Secondary | ICD-10-CM | POA: Diagnosis not present

## 2022-10-02 DIAGNOSIS — G454 Transient global amnesia: Secondary | ICD-10-CM | POA: Diagnosis not present

## 2022-10-08 DIAGNOSIS — G40909 Epilepsy, unspecified, not intractable, without status epilepticus: Secondary | ICD-10-CM | POA: Diagnosis not present

## 2022-10-08 DIAGNOSIS — F32A Depression, unspecified: Secondary | ICD-10-CM | POA: Diagnosis not present

## 2022-10-08 DIAGNOSIS — S069X1S Unspecified intracranial injury with loss of consciousness of 30 minutes or less, sequela: Secondary | ICD-10-CM | POA: Diagnosis not present

## 2022-10-08 DIAGNOSIS — D508 Other iron deficiency anemias: Secondary | ICD-10-CM | POA: Diagnosis not present

## 2022-10-08 DIAGNOSIS — E785 Hyperlipidemia, unspecified: Secondary | ICD-10-CM | POA: Diagnosis not present

## 2022-10-08 DIAGNOSIS — E1140 Type 2 diabetes mellitus with diabetic neuropathy, unspecified: Secondary | ICD-10-CM | POA: Diagnosis not present

## 2022-10-08 DIAGNOSIS — K219 Gastro-esophageal reflux disease without esophagitis: Secondary | ICD-10-CM | POA: Diagnosis not present

## 2022-10-09 DIAGNOSIS — N390 Urinary tract infection, site not specified: Secondary | ICD-10-CM | POA: Diagnosis not present

## 2022-10-30 DIAGNOSIS — D508 Other iron deficiency anemias: Secondary | ICD-10-CM | POA: Diagnosis not present

## 2022-10-30 DIAGNOSIS — E1140 Type 2 diabetes mellitus with diabetic neuropathy, unspecified: Secondary | ICD-10-CM | POA: Diagnosis not present

## 2022-10-30 DIAGNOSIS — E782 Mixed hyperlipidemia: Secondary | ICD-10-CM | POA: Diagnosis not present

## 2022-10-30 DIAGNOSIS — E038 Other specified hypothyroidism: Secondary | ICD-10-CM | POA: Diagnosis not present

## 2022-10-30 DIAGNOSIS — D518 Other vitamin B12 deficiency anemias: Secondary | ICD-10-CM | POA: Diagnosis not present

## 2022-10-30 DIAGNOSIS — E7849 Other hyperlipidemia: Secondary | ICD-10-CM | POA: Diagnosis not present

## 2022-10-30 DIAGNOSIS — E785 Hyperlipidemia, unspecified: Secondary | ICD-10-CM | POA: Diagnosis not present

## 2022-10-30 DIAGNOSIS — E1165 Type 2 diabetes mellitus with hyperglycemia: Secondary | ICD-10-CM | POA: Diagnosis not present

## 2022-10-30 DIAGNOSIS — I70223 Atherosclerosis of native arteries of extremities with rest pain, bilateral legs: Secondary | ICD-10-CM | POA: Diagnosis not present

## 2022-10-30 DIAGNOSIS — F411 Generalized anxiety disorder: Secondary | ICD-10-CM | POA: Diagnosis not present

## 2022-10-30 DIAGNOSIS — E119 Type 2 diabetes mellitus without complications: Secondary | ICD-10-CM | POA: Diagnosis not present

## 2022-10-30 DIAGNOSIS — E559 Vitamin D deficiency, unspecified: Secondary | ICD-10-CM | POA: Diagnosis not present

## 2022-11-01 DIAGNOSIS — N390 Urinary tract infection, site not specified: Secondary | ICD-10-CM | POA: Diagnosis not present

## 2022-11-04 DIAGNOSIS — F419 Anxiety disorder, unspecified: Secondary | ICD-10-CM | POA: Diagnosis not present

## 2022-11-04 DIAGNOSIS — Z87820 Personal history of traumatic brain injury: Secondary | ICD-10-CM | POA: Diagnosis not present

## 2022-11-04 DIAGNOSIS — G454 Transient global amnesia: Secondary | ICD-10-CM | POA: Diagnosis not present

## 2022-11-04 DIAGNOSIS — E1165 Type 2 diabetes mellitus with hyperglycemia: Secondary | ICD-10-CM | POA: Diagnosis not present

## 2022-11-05 DIAGNOSIS — K219 Gastro-esophageal reflux disease without esophagitis: Secondary | ICD-10-CM | POA: Diagnosis not present

## 2022-11-05 DIAGNOSIS — E785 Hyperlipidemia, unspecified: Secondary | ICD-10-CM | POA: Diagnosis not present

## 2022-11-05 DIAGNOSIS — Z794 Long term (current) use of insulin: Secondary | ICD-10-CM | POA: Diagnosis not present

## 2022-11-05 DIAGNOSIS — E1140 Type 2 diabetes mellitus with diabetic neuropathy, unspecified: Secondary | ICD-10-CM | POA: Diagnosis not present

## 2022-11-05 DIAGNOSIS — F411 Generalized anxiety disorder: Secondary | ICD-10-CM | POA: Diagnosis not present

## 2022-11-18 DIAGNOSIS — R0989 Other specified symptoms and signs involving the circulatory and respiratory systems: Secondary | ICD-10-CM | POA: Diagnosis not present

## 2022-11-27 DIAGNOSIS — F419 Anxiety disorder, unspecified: Secondary | ICD-10-CM | POA: Diagnosis not present

## 2022-11-27 DIAGNOSIS — G454 Transient global amnesia: Secondary | ICD-10-CM | POA: Diagnosis not present

## 2022-11-27 DIAGNOSIS — Z87820 Personal history of traumatic brain injury: Secondary | ICD-10-CM | POA: Diagnosis not present

## 2022-11-27 DIAGNOSIS — R454 Irritability and anger: Secondary | ICD-10-CM | POA: Diagnosis not present

## 2022-11-29 DIAGNOSIS — E1165 Type 2 diabetes mellitus with hyperglycemia: Secondary | ICD-10-CM | POA: Diagnosis not present

## 2022-11-29 DIAGNOSIS — Z794 Long term (current) use of insulin: Secondary | ICD-10-CM | POA: Diagnosis not present

## 2022-11-29 DIAGNOSIS — F411 Generalized anxiety disorder: Secondary | ICD-10-CM | POA: Diagnosis not present

## 2022-11-29 DIAGNOSIS — G40909 Epilepsy, unspecified, not intractable, without status epilepticus: Secondary | ICD-10-CM | POA: Diagnosis not present

## 2022-12-03 DIAGNOSIS — D518 Other vitamin B12 deficiency anemias: Secondary | ICD-10-CM | POA: Diagnosis not present

## 2022-12-03 DIAGNOSIS — I70223 Atherosclerosis of native arteries of extremities with rest pain, bilateral legs: Secondary | ICD-10-CM | POA: Diagnosis not present

## 2022-12-03 DIAGNOSIS — E559 Vitamin D deficiency, unspecified: Secondary | ICD-10-CM | POA: Diagnosis not present

## 2022-12-03 DIAGNOSIS — F411 Generalized anxiety disorder: Secondary | ICD-10-CM | POA: Diagnosis not present

## 2022-12-03 DIAGNOSIS — E1165 Type 2 diabetes mellitus with hyperglycemia: Secondary | ICD-10-CM | POA: Diagnosis not present

## 2022-12-03 DIAGNOSIS — E038 Other specified hypothyroidism: Secondary | ICD-10-CM | POA: Diagnosis not present

## 2022-12-03 DIAGNOSIS — E7849 Other hyperlipidemia: Secondary | ICD-10-CM | POA: Diagnosis not present

## 2022-12-03 DIAGNOSIS — E1140 Type 2 diabetes mellitus with diabetic neuropathy, unspecified: Secondary | ICD-10-CM | POA: Diagnosis not present

## 2022-12-03 DIAGNOSIS — D508 Other iron deficiency anemias: Secondary | ICD-10-CM | POA: Diagnosis not present

## 2022-12-03 DIAGNOSIS — E785 Hyperlipidemia, unspecified: Secondary | ICD-10-CM | POA: Diagnosis not present

## 2022-12-03 DIAGNOSIS — E119 Type 2 diabetes mellitus without complications: Secondary | ICD-10-CM | POA: Diagnosis not present

## 2022-12-03 DIAGNOSIS — E782 Mixed hyperlipidemia: Secondary | ICD-10-CM | POA: Diagnosis not present

## 2022-12-04 DIAGNOSIS — E1165 Type 2 diabetes mellitus with hyperglycemia: Secondary | ICD-10-CM | POA: Diagnosis not present

## 2022-12-05 DIAGNOSIS — R42 Dizziness and giddiness: Secondary | ICD-10-CM | POA: Diagnosis not present

## 2022-12-05 DIAGNOSIS — R001 Bradycardia, unspecified: Secondary | ICD-10-CM | POA: Diagnosis not present

## 2022-12-05 DIAGNOSIS — R739 Hyperglycemia, unspecified: Secondary | ICD-10-CM | POA: Diagnosis not present

## 2022-12-05 DIAGNOSIS — Z8669 Personal history of other diseases of the nervous system and sense organs: Secondary | ICD-10-CM | POA: Diagnosis not present

## 2022-12-05 DIAGNOSIS — Z79899 Other long term (current) drug therapy: Secondary | ICD-10-CM | POA: Diagnosis not present

## 2022-12-05 DIAGNOSIS — R4182 Altered mental status, unspecified: Secondary | ICD-10-CM | POA: Diagnosis not present

## 2022-12-05 DIAGNOSIS — S0990XA Unspecified injury of head, initial encounter: Secondary | ICD-10-CM | POA: Diagnosis not present

## 2022-12-05 DIAGNOSIS — W19XXXA Unspecified fall, initial encounter: Secondary | ICD-10-CM | POA: Diagnosis not present

## 2022-12-05 DIAGNOSIS — R55 Syncope and collapse: Secondary | ICD-10-CM | POA: Diagnosis not present

## 2022-12-05 DIAGNOSIS — G9389 Other specified disorders of brain: Secondary | ICD-10-CM | POA: Diagnosis not present

## 2022-12-09 DIAGNOSIS — Z87820 Personal history of traumatic brain injury: Secondary | ICD-10-CM | POA: Diagnosis not present

## 2022-12-09 DIAGNOSIS — F419 Anxiety disorder, unspecified: Secondary | ICD-10-CM | POA: Diagnosis not present

## 2022-12-09 DIAGNOSIS — G454 Transient global amnesia: Secondary | ICD-10-CM | POA: Diagnosis not present

## 2022-12-11 DIAGNOSIS — G4489 Other headache syndrome: Secondary | ICD-10-CM | POA: Diagnosis not present

## 2022-12-11 DIAGNOSIS — F0781 Postconcussional syndrome: Secondary | ICD-10-CM | POA: Diagnosis not present

## 2022-12-11 DIAGNOSIS — R262 Difficulty in walking, not elsewhere classified: Secondary | ICD-10-CM | POA: Diagnosis not present

## 2022-12-11 DIAGNOSIS — E7800 Pure hypercholesterolemia, unspecified: Secondary | ICD-10-CM | POA: Diagnosis not present

## 2022-12-11 DIAGNOSIS — F17210 Nicotine dependence, cigarettes, uncomplicated: Secondary | ICD-10-CM | POA: Diagnosis not present

## 2022-12-11 DIAGNOSIS — R42 Dizziness and giddiness: Secondary | ICD-10-CM | POA: Diagnosis not present

## 2022-12-16 ENCOUNTER — Telehealth: Payer: Self-pay

## 2022-12-16 NOTE — Telephone Encounter (Signed)
Transition Care Management Unsuccessful Follow-up Telephone Call  Date of discharge and from where:  [facility] 6/5  Attempts:  1st Attempt  Reason for unsuccessful TCM follow-up call:  Unable to leave message   Lenard Forth [facility] Guide, [facility] [phone] [address] [place], [place], [place] [address] Phone: [phone] Email: [email or web address]

## 2022-12-18 DIAGNOSIS — M79672 Pain in left foot: Secondary | ICD-10-CM | POA: Diagnosis not present

## 2022-12-18 DIAGNOSIS — E1140 Type 2 diabetes mellitus with diabetic neuropathy, unspecified: Secondary | ICD-10-CM | POA: Diagnosis not present

## 2022-12-18 DIAGNOSIS — B351 Tinea unguium: Secondary | ICD-10-CM | POA: Diagnosis not present

## 2022-12-18 DIAGNOSIS — L600 Ingrowing nail: Secondary | ICD-10-CM | POA: Diagnosis not present

## 2022-12-18 DIAGNOSIS — M79671 Pain in right foot: Secondary | ICD-10-CM | POA: Diagnosis not present

## 2022-12-20 DIAGNOSIS — N390 Urinary tract infection, site not specified: Secondary | ICD-10-CM | POA: Diagnosis not present

## 2022-12-25 DIAGNOSIS — R451 Restlessness and agitation: Secondary | ICD-10-CM | POA: Diagnosis not present

## 2022-12-25 DIAGNOSIS — F419 Anxiety disorder, unspecified: Secondary | ICD-10-CM | POA: Diagnosis not present

## 2022-12-25 DIAGNOSIS — Z87820 Personal history of traumatic brain injury: Secondary | ICD-10-CM | POA: Diagnosis not present

## 2022-12-25 DIAGNOSIS — G454 Transient global amnesia: Secondary | ICD-10-CM | POA: Diagnosis not present

## 2023-01-01 DIAGNOSIS — R451 Restlessness and agitation: Secondary | ICD-10-CM | POA: Diagnosis not present

## 2023-01-01 DIAGNOSIS — F419 Anxiety disorder, unspecified: Secondary | ICD-10-CM | POA: Diagnosis not present

## 2023-01-01 DIAGNOSIS — F5105 Insomnia due to other mental disorder: Secondary | ICD-10-CM | POA: Diagnosis not present

## 2023-01-01 DIAGNOSIS — F32A Depression, unspecified: Secondary | ICD-10-CM | POA: Diagnosis not present

## 2023-01-02 DIAGNOSIS — Z79899 Other long term (current) drug therapy: Secondary | ICD-10-CM | POA: Diagnosis not present

## 2023-01-02 DIAGNOSIS — E1165 Type 2 diabetes mellitus with hyperglycemia: Secondary | ICD-10-CM | POA: Diagnosis not present

## 2023-01-02 DIAGNOSIS — I70223 Atherosclerosis of native arteries of extremities with rest pain, bilateral legs: Secondary | ICD-10-CM | POA: Diagnosis not present

## 2023-01-02 DIAGNOSIS — E782 Mixed hyperlipidemia: Secondary | ICD-10-CM | POA: Diagnosis not present

## 2023-01-02 DIAGNOSIS — E785 Hyperlipidemia, unspecified: Secondary | ICD-10-CM | POA: Diagnosis not present

## 2023-01-02 DIAGNOSIS — D518 Other vitamin B12 deficiency anemias: Secondary | ICD-10-CM | POA: Diagnosis not present

## 2023-01-02 DIAGNOSIS — E038 Other specified hypothyroidism: Secondary | ICD-10-CM | POA: Diagnosis not present

## 2023-01-02 DIAGNOSIS — D508 Other iron deficiency anemias: Secondary | ICD-10-CM | POA: Diagnosis not present

## 2023-01-02 DIAGNOSIS — F411 Generalized anxiety disorder: Secondary | ICD-10-CM | POA: Diagnosis not present

## 2023-01-02 DIAGNOSIS — E119 Type 2 diabetes mellitus without complications: Secondary | ICD-10-CM | POA: Diagnosis not present

## 2023-01-02 DIAGNOSIS — E1140 Type 2 diabetes mellitus with diabetic neuropathy, unspecified: Secondary | ICD-10-CM | POA: Diagnosis not present

## 2023-01-02 DIAGNOSIS — E559 Vitamin D deficiency, unspecified: Secondary | ICD-10-CM | POA: Diagnosis not present

## 2023-01-02 DIAGNOSIS — E7849 Other hyperlipidemia: Secondary | ICD-10-CM | POA: Diagnosis not present

## 2023-01-04 DIAGNOSIS — E1165 Type 2 diabetes mellitus with hyperglycemia: Secondary | ICD-10-CM | POA: Diagnosis not present

## 2023-01-18 DIAGNOSIS — F32A Depression, unspecified: Secondary | ICD-10-CM | POA: Diagnosis not present

## 2023-01-18 DIAGNOSIS — F419 Anxiety disorder, unspecified: Secondary | ICD-10-CM | POA: Diagnosis not present

## 2023-01-22 DIAGNOSIS — F32A Depression, unspecified: Secondary | ICD-10-CM | POA: Diagnosis not present

## 2023-01-22 DIAGNOSIS — Z87820 Personal history of traumatic brain injury: Secondary | ICD-10-CM | POA: Diagnosis not present

## 2023-01-22 DIAGNOSIS — G454 Transient global amnesia: Secondary | ICD-10-CM | POA: Diagnosis not present

## 2023-01-22 DIAGNOSIS — F5105 Insomnia due to other mental disorder: Secondary | ICD-10-CM | POA: Diagnosis not present

## 2023-01-22 DIAGNOSIS — R451 Restlessness and agitation: Secondary | ICD-10-CM | POA: Diagnosis not present

## 2023-01-22 DIAGNOSIS — F419 Anxiety disorder, unspecified: Secondary | ICD-10-CM | POA: Diagnosis not present

## 2023-01-31 DIAGNOSIS — D508 Other iron deficiency anemias: Secondary | ICD-10-CM | POA: Diagnosis not present

## 2023-01-31 DIAGNOSIS — E559 Vitamin D deficiency, unspecified: Secondary | ICD-10-CM | POA: Diagnosis not present

## 2023-01-31 DIAGNOSIS — I70223 Atherosclerosis of native arteries of extremities with rest pain, bilateral legs: Secondary | ICD-10-CM | POA: Diagnosis not present

## 2023-01-31 DIAGNOSIS — E7849 Other hyperlipidemia: Secondary | ICD-10-CM | POA: Diagnosis not present

## 2023-01-31 DIAGNOSIS — D518 Other vitamin B12 deficiency anemias: Secondary | ICD-10-CM | POA: Diagnosis not present

## 2023-01-31 DIAGNOSIS — E785 Hyperlipidemia, unspecified: Secondary | ICD-10-CM | POA: Diagnosis not present

## 2023-01-31 DIAGNOSIS — E782 Mixed hyperlipidemia: Secondary | ICD-10-CM | POA: Diagnosis not present

## 2023-01-31 DIAGNOSIS — E038 Other specified hypothyroidism: Secondary | ICD-10-CM | POA: Diagnosis not present

## 2023-01-31 DIAGNOSIS — E1140 Type 2 diabetes mellitus with diabetic neuropathy, unspecified: Secondary | ICD-10-CM | POA: Diagnosis not present

## 2023-01-31 DIAGNOSIS — F411 Generalized anxiety disorder: Secondary | ICD-10-CM | POA: Diagnosis not present

## 2023-01-31 DIAGNOSIS — E1165 Type 2 diabetes mellitus with hyperglycemia: Secondary | ICD-10-CM | POA: Diagnosis not present

## 2023-01-31 DIAGNOSIS — E119 Type 2 diabetes mellitus without complications: Secondary | ICD-10-CM | POA: Diagnosis not present

## 2023-02-03 DIAGNOSIS — E1165 Type 2 diabetes mellitus with hyperglycemia: Secondary | ICD-10-CM | POA: Diagnosis not present

## 2023-02-05 DIAGNOSIS — F419 Anxiety disorder, unspecified: Secondary | ICD-10-CM | POA: Diagnosis not present

## 2023-02-05 DIAGNOSIS — G454 Transient global amnesia: Secondary | ICD-10-CM | POA: Diagnosis not present

## 2023-02-05 DIAGNOSIS — R451 Restlessness and agitation: Secondary | ICD-10-CM | POA: Diagnosis not present

## 2023-02-05 DIAGNOSIS — F5105 Insomnia due to other mental disorder: Secondary | ICD-10-CM | POA: Diagnosis not present

## 2023-02-05 DIAGNOSIS — F32A Depression, unspecified: Secondary | ICD-10-CM | POA: Diagnosis not present

## 2023-02-05 DIAGNOSIS — Z87820 Personal history of traumatic brain injury: Secondary | ICD-10-CM | POA: Diagnosis not present

## 2023-02-11 DIAGNOSIS — Z87820 Personal history of traumatic brain injury: Secondary | ICD-10-CM | POA: Diagnosis not present

## 2023-02-11 DIAGNOSIS — R29898 Other symptoms and signs involving the musculoskeletal system: Secondary | ICD-10-CM | POA: Diagnosis not present

## 2023-02-11 DIAGNOSIS — F419 Anxiety disorder, unspecified: Secondary | ICD-10-CM | POA: Diagnosis not present

## 2023-02-11 DIAGNOSIS — Z794 Long term (current) use of insulin: Secondary | ICD-10-CM | POA: Diagnosis not present

## 2023-02-11 DIAGNOSIS — G454 Transient global amnesia: Secondary | ICD-10-CM | POA: Diagnosis not present

## 2023-02-11 DIAGNOSIS — Z62812 Personal history of neglect in childhood: Secondary | ICD-10-CM | POA: Diagnosis not present

## 2023-02-11 DIAGNOSIS — R451 Restlessness and agitation: Secondary | ICD-10-CM | POA: Diagnosis not present

## 2023-02-11 DIAGNOSIS — R456 Violent behavior: Secondary | ICD-10-CM | POA: Diagnosis not present

## 2023-02-11 DIAGNOSIS — F603 Borderline personality disorder: Secondary | ICD-10-CM | POA: Diagnosis not present

## 2023-02-11 DIAGNOSIS — R739 Hyperglycemia, unspecified: Secondary | ICD-10-CM | POA: Diagnosis not present

## 2023-02-11 DIAGNOSIS — E1140 Type 2 diabetes mellitus with diabetic neuropathy, unspecified: Secondary | ICD-10-CM | POA: Diagnosis not present

## 2023-02-11 DIAGNOSIS — F17210 Nicotine dependence, cigarettes, uncomplicated: Secondary | ICD-10-CM | POA: Diagnosis not present

## 2023-02-11 DIAGNOSIS — F411 Generalized anxiety disorder: Secondary | ICD-10-CM | POA: Diagnosis not present

## 2023-02-11 DIAGNOSIS — F430 Acute stress reaction: Secondary | ICD-10-CM | POA: Diagnosis not present

## 2023-02-11 DIAGNOSIS — S062X9D Diffuse traumatic brain injury with loss of consciousness of unspecified duration, subsequent encounter: Secondary | ICD-10-CM | POA: Diagnosis not present

## 2023-02-11 DIAGNOSIS — E1165 Type 2 diabetes mellitus with hyperglycemia: Secondary | ICD-10-CM | POA: Diagnosis not present

## 2023-02-11 DIAGNOSIS — F4310 Post-traumatic stress disorder, unspecified: Secondary | ICD-10-CM | POA: Diagnosis not present

## 2023-02-11 DIAGNOSIS — Z62819 Personal history of unspecified abuse in childhood: Secondary | ICD-10-CM | POA: Diagnosis not present

## 2023-02-11 DIAGNOSIS — Z599 Problem related to housing and economic circumstances, unspecified: Secondary | ICD-10-CM | POA: Diagnosis not present

## 2023-02-12 ENCOUNTER — Telehealth: Payer: Self-pay | Admitting: Physician Assistant
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
## 2023-02-14 DIAGNOSIS — Z1321 Encounter for screening for nutritional disorder: Secondary | ICD-10-CM | POA: Diagnosis not present

## 2023-02-14 DIAGNOSIS — R561 Post traumatic seizures: Secondary | ICD-10-CM | POA: Diagnosis not present

## 2023-02-14 DIAGNOSIS — Z13228 Encounter for screening for other metabolic disorders: Secondary | ICD-10-CM | POA: Diagnosis not present

## 2023-02-14 DIAGNOSIS — D519 Vitamin B12 deficiency anemia, unspecified: Secondary | ICD-10-CM | POA: Diagnosis not present

## 2023-02-14 DIAGNOSIS — Z87820 Personal history of traumatic brain injury: Secondary | ICD-10-CM | POA: Diagnosis not present

## 2023-02-14 DIAGNOSIS — F419 Anxiety disorder, unspecified: Secondary | ICD-10-CM | POA: Diagnosis not present

## 2023-02-14 DIAGNOSIS — E785 Hyperlipidemia, unspecified: Secondary | ICD-10-CM | POA: Diagnosis not present

## 2023-02-14 DIAGNOSIS — E1165 Type 2 diabetes mellitus with hyperglycemia: Secondary | ICD-10-CM | POA: Diagnosis not present

## 2023-02-14 DIAGNOSIS — Z125 Encounter for screening for malignant neoplasm of prostate: Secondary | ICD-10-CM | POA: Diagnosis not present

## 2023-02-17 NOTE — Telephone Encounter (Signed)
Randy Richards notified of recommendation, provided phone #.

## 2023-02-17 NOTE — Telephone Encounter (Signed)
I recommend Dr. Rosann Auerbach, with [facility].  He's a neuropsychologist.  I agree for them to call the psych provider at the nursing facility since he is much more familiar with [facility] now.
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
## 2023-02-24 DIAGNOSIS — Z87820 Personal history of traumatic brain injury: Secondary | ICD-10-CM | POA: Diagnosis not present

## 2023-02-24 DIAGNOSIS — R561 Post traumatic seizures: Secondary | ICD-10-CM | POA: Diagnosis not present

## 2023-02-24 DIAGNOSIS — E1165 Type 2 diabetes mellitus with hyperglycemia: Secondary | ICD-10-CM | POA: Diagnosis not present

## 2023-02-26 DIAGNOSIS — Z87820 Personal history of traumatic brain injury: Secondary | ICD-10-CM | POA: Diagnosis not present

## 2023-02-26 DIAGNOSIS — R451 Restlessness and agitation: Secondary | ICD-10-CM | POA: Diagnosis not present

## 2023-02-26 DIAGNOSIS — F5105 Insomnia due to other mental disorder: Secondary | ICD-10-CM | POA: Diagnosis not present

## 2023-02-26 DIAGNOSIS — F32A Depression, unspecified: Secondary | ICD-10-CM | POA: Diagnosis not present

## 2023-02-26 DIAGNOSIS — F419 Anxiety disorder, unspecified: Secondary | ICD-10-CM | POA: Diagnosis not present

## 2023-02-26 DIAGNOSIS — G454 Transient global amnesia: Secondary | ICD-10-CM | POA: Diagnosis not present

## 2023-02-28 DIAGNOSIS — I70223 Atherosclerosis of native arteries of extremities with rest pain, bilateral legs: Secondary | ICD-10-CM | POA: Diagnosis not present

## 2023-02-28 DIAGNOSIS — E1165 Type 2 diabetes mellitus with hyperglycemia: Secondary | ICD-10-CM | POA: Diagnosis not present

## 2023-02-28 DIAGNOSIS — E7849 Other hyperlipidemia: Secondary | ICD-10-CM | POA: Diagnosis not present

## 2023-02-28 DIAGNOSIS — E785 Hyperlipidemia, unspecified: Secondary | ICD-10-CM | POA: Diagnosis not present

## 2023-02-28 DIAGNOSIS — E559 Vitamin D deficiency, unspecified: Secondary | ICD-10-CM | POA: Diagnosis not present

## 2023-02-28 DIAGNOSIS — D508 Other iron deficiency anemias: Secondary | ICD-10-CM | POA: Diagnosis not present

## 2023-02-28 DIAGNOSIS — F411 Generalized anxiety disorder: Secondary | ICD-10-CM | POA: Diagnosis not present

## 2023-02-28 DIAGNOSIS — E782 Mixed hyperlipidemia: Secondary | ICD-10-CM | POA: Diagnosis not present

## 2023-02-28 DIAGNOSIS — E119 Type 2 diabetes mellitus without complications: Secondary | ICD-10-CM | POA: Diagnosis not present

## 2023-02-28 DIAGNOSIS — D518 Other vitamin B12 deficiency anemias: Secondary | ICD-10-CM | POA: Diagnosis not present

## 2023-02-28 DIAGNOSIS — E1140 Type 2 diabetes mellitus with diabetic neuropathy, unspecified: Secondary | ICD-10-CM | POA: Diagnosis not present

## 2023-02-28 DIAGNOSIS — E038 Other specified hypothyroidism: Secondary | ICD-10-CM | POA: Diagnosis not present

## 2023-03-06 DIAGNOSIS — E1165 Type 2 diabetes mellitus with hyperglycemia: Secondary | ICD-10-CM | POA: Diagnosis not present

## 2023-03-08 DIAGNOSIS — R739 Hyperglycemia, unspecified: Secondary | ICD-10-CM | POA: Diagnosis not present

## 2023-03-10 DIAGNOSIS — Z87820 Personal history of traumatic brain injury: Secondary | ICD-10-CM | POA: Diagnosis not present

## 2023-03-10 DIAGNOSIS — M19072 Primary osteoarthritis, left ankle and foot: Secondary | ICD-10-CM | POA: Diagnosis not present

## 2023-03-10 DIAGNOSIS — M79672 Pain in left foot: Secondary | ICD-10-CM | POA: Diagnosis not present

## 2023-03-10 DIAGNOSIS — F5105 Insomnia due to other mental disorder: Secondary | ICD-10-CM | POA: Diagnosis not present

## 2023-03-10 DIAGNOSIS — W19XXXA Unspecified fall, initial encounter: Secondary | ICD-10-CM | POA: Diagnosis not present

## 2023-03-10 DIAGNOSIS — F32A Depression, unspecified: Secondary | ICD-10-CM | POA: Diagnosis not present

## 2023-03-10 DIAGNOSIS — G454 Transient global amnesia: Secondary | ICD-10-CM | POA: Diagnosis not present

## 2023-03-10 DIAGNOSIS — M25572 Pain in left ankle and joints of left foot: Secondary | ICD-10-CM | POA: Diagnosis not present

## 2023-03-10 DIAGNOSIS — F419 Anxiety disorder, unspecified: Secondary | ICD-10-CM | POA: Diagnosis not present

## 2023-03-10 DIAGNOSIS — S93402A Sprain of unspecified ligament of left ankle, initial encounter: Secondary | ICD-10-CM | POA: Diagnosis not present

## 2023-03-10 DIAGNOSIS — Q6672 Congenital pes cavus, left foot: Secondary | ICD-10-CM | POA: Diagnosis not present

## 2023-03-10 DIAGNOSIS — R69 Illness, unspecified: Secondary | ICD-10-CM | POA: Diagnosis not present

## 2023-03-13 DIAGNOSIS — E119 Type 2 diabetes mellitus without complications: Secondary | ICD-10-CM | POA: Diagnosis not present

## 2023-03-13 DIAGNOSIS — D518 Other vitamin B12 deficiency anemias: Secondary | ICD-10-CM | POA: Diagnosis not present

## 2023-03-13 DIAGNOSIS — E038 Other specified hypothyroidism: Secondary | ICD-10-CM | POA: Diagnosis not present

## 2023-03-13 DIAGNOSIS — E782 Mixed hyperlipidemia: Secondary | ICD-10-CM | POA: Diagnosis not present

## 2023-03-13 DIAGNOSIS — Z79899 Other long term (current) drug therapy: Secondary | ICD-10-CM | POA: Diagnosis not present

## 2023-03-17 DIAGNOSIS — E785 Hyperlipidemia, unspecified: Secondary | ICD-10-CM | POA: Diagnosis not present

## 2023-03-17 DIAGNOSIS — D508 Other iron deficiency anemias: Secondary | ICD-10-CM | POA: Diagnosis not present

## 2023-03-17 DIAGNOSIS — E1165 Type 2 diabetes mellitus with hyperglycemia: Secondary | ICD-10-CM | POA: Diagnosis not present

## 2023-03-17 DIAGNOSIS — F32A Depression, unspecified: Secondary | ICD-10-CM | POA: Diagnosis not present

## 2023-03-17 DIAGNOSIS — R2681 Unsteadiness on feet: Secondary | ICD-10-CM | POA: Diagnosis not present

## 2023-03-17 DIAGNOSIS — R296 Repeated falls: Secondary | ICD-10-CM | POA: Diagnosis not present

## 2023-03-27 DIAGNOSIS — M79671 Pain in right foot: Secondary | ICD-10-CM | POA: Diagnosis not present

## 2023-03-27 DIAGNOSIS — M79672 Pain in left foot: Secondary | ICD-10-CM | POA: Diagnosis not present

## 2023-03-27 DIAGNOSIS — Q6672 Congenital pes cavus, left foot: Secondary | ICD-10-CM | POA: Diagnosis not present

## 2023-03-27 DIAGNOSIS — G8310 Monoplegia of lower limb affecting unspecified side: Secondary | ICD-10-CM | POA: Diagnosis not present

## 2023-03-27 DIAGNOSIS — B351 Tinea unguium: Secondary | ICD-10-CM | POA: Diagnosis not present

## 2023-03-27 DIAGNOSIS — E1140 Type 2 diabetes mellitus with diabetic neuropathy, unspecified: Secondary | ICD-10-CM | POA: Diagnosis not present

## 2023-03-27 DIAGNOSIS — I8393 Asymptomatic varicose veins of bilateral lower extremities: Secondary | ICD-10-CM | POA: Diagnosis not present

## 2023-03-27 DIAGNOSIS — Q6671 Congenital pes cavus, right foot: Secondary | ICD-10-CM | POA: Diagnosis not present

## 2023-03-27 DIAGNOSIS — L600 Ingrowing nail: Secondary | ICD-10-CM | POA: Diagnosis not present

## 2023-04-01 DIAGNOSIS — H00019 Hordeolum externum unspecified eye, unspecified eyelid: Secondary | ICD-10-CM | POA: Diagnosis not present

## 2023-04-03 DIAGNOSIS — E559 Vitamin D deficiency, unspecified: Secondary | ICD-10-CM | POA: Diagnosis not present

## 2023-04-03 DIAGNOSIS — D508 Other iron deficiency anemias: Secondary | ICD-10-CM | POA: Diagnosis not present

## 2023-04-03 DIAGNOSIS — E782 Mixed hyperlipidemia: Secondary | ICD-10-CM | POA: Diagnosis not present

## 2023-04-03 DIAGNOSIS — E1165 Type 2 diabetes mellitus with hyperglycemia: Secondary | ICD-10-CM | POA: Diagnosis not present

## 2023-04-03 DIAGNOSIS — I70223 Atherosclerosis of native arteries of extremities with rest pain, bilateral legs: Secondary | ICD-10-CM | POA: Diagnosis not present

## 2023-04-03 DIAGNOSIS — E038 Other specified hypothyroidism: Secondary | ICD-10-CM | POA: Diagnosis not present

## 2023-04-03 DIAGNOSIS — F411 Generalized anxiety disorder: Secondary | ICD-10-CM | POA: Diagnosis not present

## 2023-04-03 DIAGNOSIS — E119 Type 2 diabetes mellitus without complications: Secondary | ICD-10-CM | POA: Diagnosis not present

## 2023-04-03 DIAGNOSIS — E7849 Other hyperlipidemia: Secondary | ICD-10-CM | POA: Diagnosis not present

## 2023-04-03 DIAGNOSIS — E785 Hyperlipidemia, unspecified: Secondary | ICD-10-CM | POA: Diagnosis not present

## 2023-04-03 DIAGNOSIS — E1140 Type 2 diabetes mellitus with diabetic neuropathy, unspecified: Secondary | ICD-10-CM | POA: Diagnosis not present

## 2023-04-03 DIAGNOSIS — D518 Other vitamin B12 deficiency anemias: Secondary | ICD-10-CM | POA: Diagnosis not present

## 2023-04-04 DIAGNOSIS — R55 Syncope and collapse: Secondary | ICD-10-CM | POA: Diagnosis not present

## 2023-04-04 DIAGNOSIS — W19XXXA Unspecified fall, initial encounter: Secondary | ICD-10-CM | POA: Diagnosis not present

## 2023-04-04 DIAGNOSIS — S0990XA Unspecified injury of head, initial encounter: Secondary | ICD-10-CM | POA: Diagnosis not present

## 2023-04-04 DIAGNOSIS — R519 Headache, unspecified: Secondary | ICD-10-CM | POA: Diagnosis not present

## 2023-04-04 DIAGNOSIS — G319 Degenerative disease of nervous system, unspecified: Secondary | ICD-10-CM | POA: Diagnosis not present

## 2023-04-04 DIAGNOSIS — R739 Hyperglycemia, unspecified: Secondary | ICD-10-CM | POA: Diagnosis not present

## 2023-04-04 DIAGNOSIS — G9389 Other specified disorders of brain: Secondary | ICD-10-CM | POA: Diagnosis not present

## 2023-04-06 DIAGNOSIS — E1165 Type 2 diabetes mellitus with hyperglycemia: Secondary | ICD-10-CM | POA: Diagnosis not present

## 2023-04-08 DIAGNOSIS — R296 Repeated falls: Secondary | ICD-10-CM | POA: Diagnosis not present

## 2023-04-08 DIAGNOSIS — R531 Weakness: Secondary | ICD-10-CM | POA: Diagnosis not present

## 2023-04-08 DIAGNOSIS — S0990XA Unspecified injury of head, initial encounter: Secondary | ICD-10-CM | POA: Diagnosis not present

## 2023-04-15 DIAGNOSIS — F5105 Insomnia due to other mental disorder: Secondary | ICD-10-CM | POA: Diagnosis not present

## 2023-04-15 DIAGNOSIS — F32A Depression, unspecified: Secondary | ICD-10-CM | POA: Diagnosis not present

## 2023-04-15 DIAGNOSIS — Z87820 Personal history of traumatic brain injury: Secondary | ICD-10-CM | POA: Diagnosis not present

## 2023-04-15 DIAGNOSIS — F419 Anxiety disorder, unspecified: Secondary | ICD-10-CM | POA: Diagnosis not present

## 2023-04-15 DIAGNOSIS — G454 Transient global amnesia: Secondary | ICD-10-CM | POA: Diagnosis not present

## 2023-04-21 DIAGNOSIS — E1140 Type 2 diabetes mellitus with diabetic neuropathy, unspecified: Secondary | ICD-10-CM | POA: Diagnosis not present

## 2023-04-22 DIAGNOSIS — E1165 Type 2 diabetes mellitus with hyperglycemia: Secondary | ICD-10-CM | POA: Diagnosis not present

## 2023-04-22 DIAGNOSIS — F411 Generalized anxiety disorder: Secondary | ICD-10-CM | POA: Diagnosis not present

## 2023-04-22 DIAGNOSIS — E785 Hyperlipidemia, unspecified: Secondary | ICD-10-CM | POA: Diagnosis not present

## 2023-04-22 DIAGNOSIS — F32A Depression, unspecified: Secondary | ICD-10-CM | POA: Diagnosis not present

## 2023-04-22 DIAGNOSIS — E1140 Type 2 diabetes mellitus with diabetic neuropathy, unspecified: Secondary | ICD-10-CM | POA: Diagnosis not present

## 2023-04-22 DIAGNOSIS — S069X0S Unspecified intracranial injury without loss of consciousness, sequela: Secondary | ICD-10-CM | POA: Diagnosis not present

## 2023-04-22 DIAGNOSIS — K219 Gastro-esophageal reflux disease without esophagitis: Secondary | ICD-10-CM | POA: Diagnosis not present

## 2023-05-01 DIAGNOSIS — F419 Anxiety disorder, unspecified: Secondary | ICD-10-CM | POA: Diagnosis not present

## 2023-05-01 DIAGNOSIS — F32A Depression, unspecified: Secondary | ICD-10-CM | POA: Diagnosis not present

## 2023-05-06 DIAGNOSIS — E1165 Type 2 diabetes mellitus with hyperglycemia: Secondary | ICD-10-CM | POA: Diagnosis not present

## 2023-05-07 DIAGNOSIS — D508 Other iron deficiency anemias: Secondary | ICD-10-CM | POA: Diagnosis not present

## 2023-05-07 DIAGNOSIS — E7849 Other hyperlipidemia: Secondary | ICD-10-CM | POA: Diagnosis not present

## 2023-05-07 DIAGNOSIS — I70223 Atherosclerosis of native arteries of extremities with rest pain, bilateral legs: Secondary | ICD-10-CM | POA: Diagnosis not present

## 2023-05-07 DIAGNOSIS — F411 Generalized anxiety disorder: Secondary | ICD-10-CM | POA: Diagnosis not present

## 2023-05-07 DIAGNOSIS — E119 Type 2 diabetes mellitus without complications: Secondary | ICD-10-CM | POA: Diagnosis not present

## 2023-05-07 DIAGNOSIS — E1140 Type 2 diabetes mellitus with diabetic neuropathy, unspecified: Secondary | ICD-10-CM | POA: Diagnosis not present

## 2023-05-07 DIAGNOSIS — E782 Mixed hyperlipidemia: Secondary | ICD-10-CM | POA: Diagnosis not present

## 2023-05-07 DIAGNOSIS — E1165 Type 2 diabetes mellitus with hyperglycemia: Secondary | ICD-10-CM | POA: Diagnosis not present

## 2023-05-07 DIAGNOSIS — E559 Vitamin D deficiency, unspecified: Secondary | ICD-10-CM | POA: Diagnosis not present

## 2023-05-07 DIAGNOSIS — E038 Other specified hypothyroidism: Secondary | ICD-10-CM | POA: Diagnosis not present

## 2023-05-07 DIAGNOSIS — E785 Hyperlipidemia, unspecified: Secondary | ICD-10-CM | POA: Diagnosis not present

## 2023-05-07 DIAGNOSIS — D518 Other vitamin B12 deficiency anemias: Secondary | ICD-10-CM | POA: Diagnosis not present

## 2023-05-13 DIAGNOSIS — E1140 Type 2 diabetes mellitus with diabetic neuropathy, unspecified: Secondary | ICD-10-CM | POA: Diagnosis not present

## 2023-05-13 DIAGNOSIS — E785 Hyperlipidemia, unspecified: Secondary | ICD-10-CM | POA: Diagnosis not present

## 2023-05-13 DIAGNOSIS — G40909 Epilepsy, unspecified, not intractable, without status epilepticus: Secondary | ICD-10-CM | POA: Diagnosis not present

## 2023-05-13 DIAGNOSIS — D508 Other iron deficiency anemias: Secondary | ICD-10-CM | POA: Diagnosis not present

## 2023-05-13 DIAGNOSIS — K219 Gastro-esophageal reflux disease without esophagitis: Secondary | ICD-10-CM | POA: Diagnosis not present

## 2023-05-22 DIAGNOSIS — F5105 Insomnia due to other mental disorder: Secondary | ICD-10-CM | POA: Diagnosis not present

## 2023-05-22 DIAGNOSIS — F419 Anxiety disorder, unspecified: Secondary | ICD-10-CM | POA: Diagnosis not present

## 2023-05-22 DIAGNOSIS — G454 Transient global amnesia: Secondary | ICD-10-CM | POA: Diagnosis not present

## 2023-05-22 DIAGNOSIS — Z87820 Personal history of traumatic brain injury: Secondary | ICD-10-CM | POA: Diagnosis not present

## 2023-05-22 DIAGNOSIS — F32A Depression, unspecified: Secondary | ICD-10-CM | POA: Diagnosis not present

## 2023-06-03 DIAGNOSIS — E782 Mixed hyperlipidemia: Secondary | ICD-10-CM | POA: Diagnosis not present

## 2023-06-03 DIAGNOSIS — E038 Other specified hypothyroidism: Secondary | ICD-10-CM | POA: Diagnosis not present

## 2023-06-03 DIAGNOSIS — E7849 Other hyperlipidemia: Secondary | ICD-10-CM | POA: Diagnosis not present

## 2023-06-03 DIAGNOSIS — E119 Type 2 diabetes mellitus without complications: Secondary | ICD-10-CM | POA: Diagnosis not present

## 2023-06-03 DIAGNOSIS — D508 Other iron deficiency anemias: Secondary | ICD-10-CM | POA: Diagnosis not present

## 2023-06-03 DIAGNOSIS — D518 Other vitamin B12 deficiency anemias: Secondary | ICD-10-CM | POA: Diagnosis not present

## 2023-06-03 DIAGNOSIS — F411 Generalized anxiety disorder: Secondary | ICD-10-CM | POA: Diagnosis not present

## 2023-06-03 DIAGNOSIS — E1140 Type 2 diabetes mellitus with diabetic neuropathy, unspecified: Secondary | ICD-10-CM | POA: Diagnosis not present

## 2023-06-03 DIAGNOSIS — E785 Hyperlipidemia, unspecified: Secondary | ICD-10-CM | POA: Diagnosis not present

## 2023-06-03 DIAGNOSIS — I70223 Atherosclerosis of native arteries of extremities with rest pain, bilateral legs: Secondary | ICD-10-CM | POA: Diagnosis not present

## 2023-06-03 DIAGNOSIS — E1165 Type 2 diabetes mellitus with hyperglycemia: Secondary | ICD-10-CM | POA: Diagnosis not present

## 2023-06-03 DIAGNOSIS — E559 Vitamin D deficiency, unspecified: Secondary | ICD-10-CM | POA: Diagnosis not present

## 2023-06-06 DIAGNOSIS — E1165 Type 2 diabetes mellitus with hyperglycemia: Secondary | ICD-10-CM | POA: Diagnosis not present

## 2023-06-10 DIAGNOSIS — G40909 Epilepsy, unspecified, not intractable, without status epilepticus: Secondary | ICD-10-CM | POA: Diagnosis not present

## 2023-06-10 DIAGNOSIS — F32A Depression, unspecified: Secondary | ICD-10-CM | POA: Diagnosis not present

## 2023-06-10 DIAGNOSIS — K861 Other chronic pancreatitis: Secondary | ICD-10-CM | POA: Diagnosis not present

## 2023-06-10 DIAGNOSIS — E1140 Type 2 diabetes mellitus with diabetic neuropathy, unspecified: Secondary | ICD-10-CM | POA: Diagnosis not present

## 2023-06-10 DIAGNOSIS — K219 Gastro-esophageal reflux disease without esophagitis: Secondary | ICD-10-CM | POA: Diagnosis not present

## 2023-06-10 DIAGNOSIS — E785 Hyperlipidemia, unspecified: Secondary | ICD-10-CM | POA: Diagnosis not present

## 2023-06-10 DIAGNOSIS — F411 Generalized anxiety disorder: Secondary | ICD-10-CM | POA: Diagnosis not present

## 2023-06-10 DIAGNOSIS — D508 Other iron deficiency anemias: Secondary | ICD-10-CM | POA: Diagnosis not present

## 2023-06-11 DIAGNOSIS — S0990XA Unspecified injury of head, initial encounter: Secondary | ICD-10-CM | POA: Diagnosis not present

## 2023-06-11 DIAGNOSIS — J449 Chronic obstructive pulmonary disease, unspecified: Secondary | ICD-10-CM | POA: Diagnosis not present

## 2023-06-11 DIAGNOSIS — W19XXXA Unspecified fall, initial encounter: Secondary | ICD-10-CM | POA: Diagnosis not present

## 2023-06-11 DIAGNOSIS — S199XXA Unspecified injury of neck, initial encounter: Secondary | ICD-10-CM | POA: Diagnosis not present

## 2023-06-11 DIAGNOSIS — R519 Headache, unspecified: Secondary | ICD-10-CM | POA: Diagnosis not present

## 2023-06-17 DIAGNOSIS — Z87820 Personal history of traumatic brain injury: Secondary | ICD-10-CM | POA: Diagnosis not present

## 2023-06-17 DIAGNOSIS — F419 Anxiety disorder, unspecified: Secondary | ICD-10-CM | POA: Diagnosis not present

## 2023-06-17 DIAGNOSIS — F32A Depression, unspecified: Secondary | ICD-10-CM | POA: Diagnosis not present

## 2023-06-17 DIAGNOSIS — F5105 Insomnia due to other mental disorder: Secondary | ICD-10-CM | POA: Diagnosis not present

## 2023-06-17 DIAGNOSIS — R2681 Unsteadiness on feet: Secondary | ICD-10-CM | POA: Diagnosis not present

## 2023-06-17 DIAGNOSIS — G454 Transient global amnesia: Secondary | ICD-10-CM | POA: Diagnosis not present

## 2023-06-19 DIAGNOSIS — E119 Type 2 diabetes mellitus without complications: Secondary | ICD-10-CM | POA: Diagnosis not present

## 2023-06-19 DIAGNOSIS — F411 Generalized anxiety disorder: Secondary | ICD-10-CM | POA: Diagnosis not present

## 2023-06-19 DIAGNOSIS — G40909 Epilepsy, unspecified, not intractable, without status epilepticus: Secondary | ICD-10-CM | POA: Diagnosis not present

## 2023-06-19 DIAGNOSIS — E785 Hyperlipidemia, unspecified: Secondary | ICD-10-CM | POA: Diagnosis not present

## 2023-06-19 DIAGNOSIS — I10 Essential (primary) hypertension: Secondary | ICD-10-CM | POA: Diagnosis not present

## 2023-06-19 DIAGNOSIS — K861 Other chronic pancreatitis: Secondary | ICD-10-CM | POA: Diagnosis not present

## 2023-06-19 DIAGNOSIS — Z7984 Long term (current) use of oral hypoglycemic drugs: Secondary | ICD-10-CM | POA: Diagnosis not present

## 2023-06-19 DIAGNOSIS — F17210 Nicotine dependence, cigarettes, uncomplicated: Secondary | ICD-10-CM | POA: Diagnosis not present

## 2023-06-19 DIAGNOSIS — F32A Depression, unspecified: Secondary | ICD-10-CM | POA: Diagnosis not present

## 2023-06-19 DIAGNOSIS — Z794 Long term (current) use of insulin: Secondary | ICD-10-CM | POA: Diagnosis not present

## 2023-06-19 DIAGNOSIS — Z7982 Long term (current) use of aspirin: Secondary | ICD-10-CM | POA: Diagnosis not present

## 2023-06-19 DIAGNOSIS — D649 Anemia, unspecified: Secondary | ICD-10-CM | POA: Diagnosis not present

## 2023-06-19 DIAGNOSIS — E538 Deficiency of other specified B group vitamins: Secondary | ICD-10-CM | POA: Diagnosis not present

## 2023-06-19 DIAGNOSIS — Z9181 History of falling: Secondary | ICD-10-CM | POA: Diagnosis not present

## 2023-06-19 DIAGNOSIS — K219 Gastro-esophageal reflux disease without esophagitis: Secondary | ICD-10-CM | POA: Diagnosis not present

## 2023-06-27 DIAGNOSIS — M25569 Pain in unspecified knee: Secondary | ICD-10-CM | POA: Diagnosis not present

## 2023-06-27 DIAGNOSIS — Z043 Encounter for examination and observation following other accident: Secondary | ICD-10-CM | POA: Diagnosis not present

## 2023-06-27 DIAGNOSIS — R569 Unspecified convulsions: Secondary | ICD-10-CM | POA: Diagnosis not present

## 2023-06-27 DIAGNOSIS — S0990XA Unspecified injury of head, initial encounter: Secondary | ICD-10-CM | POA: Diagnosis not present

## 2023-06-27 DIAGNOSIS — S199XXA Unspecified injury of neck, initial encounter: Secondary | ICD-10-CM | POA: Diagnosis not present

## 2023-06-27 DIAGNOSIS — W19XXXA Unspecified fall, initial encounter: Secondary | ICD-10-CM | POA: Diagnosis not present

## 2023-06-30 DIAGNOSIS — D649 Anemia, unspecified: Secondary | ICD-10-CM | POA: Diagnosis not present

## 2023-06-30 DIAGNOSIS — R296 Repeated falls: Secondary | ICD-10-CM | POA: Diagnosis not present

## 2023-06-30 DIAGNOSIS — E7800 Pure hypercholesterolemia, unspecified: Secondary | ICD-10-CM | POA: Diagnosis not present

## 2023-06-30 DIAGNOSIS — G4489 Other headache syndrome: Secondary | ICD-10-CM | POA: Diagnosis not present

## 2023-06-30 DIAGNOSIS — K219 Gastro-esophageal reflux disease without esophagitis: Secondary | ICD-10-CM | POA: Diagnosis not present

## 2023-06-30 DIAGNOSIS — R0602 Shortness of breath: Secondary | ICD-10-CM | POA: Diagnosis not present

## 2023-06-30 DIAGNOSIS — W19XXXA Unspecified fall, initial encounter: Secondary | ICD-10-CM | POA: Diagnosis not present

## 2023-06-30 DIAGNOSIS — M25571 Pain in right ankle and joints of right foot: Secondary | ICD-10-CM | POA: Diagnosis not present

## 2023-06-30 DIAGNOSIS — E1165 Type 2 diabetes mellitus with hyperglycemia: Secondary | ICD-10-CM | POA: Diagnosis not present

## 2023-06-30 DIAGNOSIS — R260 Ataxic gait: Secondary | ICD-10-CM | POA: Diagnosis not present

## 2023-06-30 DIAGNOSIS — J449 Chronic obstructive pulmonary disease, unspecified: Secondary | ICD-10-CM | POA: Diagnosis not present

## 2023-06-30 DIAGNOSIS — R739 Hyperglycemia, unspecified: Secondary | ICD-10-CM | POA: Diagnosis not present

## 2023-06-30 DIAGNOSIS — M549 Dorsalgia, unspecified: Secondary | ICD-10-CM | POA: Diagnosis not present

## 2023-06-30 DIAGNOSIS — Z7982 Long term (current) use of aspirin: Secondary | ICD-10-CM | POA: Diagnosis not present

## 2023-06-30 DIAGNOSIS — R519 Headache, unspecified: Secondary | ICD-10-CM | POA: Diagnosis not present

## 2023-06-30 DIAGNOSIS — Z79899 Other long term (current) drug therapy: Secondary | ICD-10-CM | POA: Diagnosis not present

## 2023-06-30 DIAGNOSIS — R42 Dizziness and giddiness: Secondary | ICD-10-CM | POA: Diagnosis not present

## 2023-06-30 DIAGNOSIS — Z794 Long term (current) use of insulin: Secondary | ICD-10-CM | POA: Diagnosis not present

## 2023-07-01 DIAGNOSIS — R739 Hyperglycemia, unspecified: Secondary | ICD-10-CM | POA: Diagnosis not present

## 2023-07-01 DIAGNOSIS — R260 Ataxic gait: Secondary | ICD-10-CM | POA: Diagnosis not present

## 2023-07-01 DIAGNOSIS — R42 Dizziness and giddiness: Secondary | ICD-10-CM | POA: Diagnosis not present

## 2023-07-05 DIAGNOSIS — E1165 Type 2 diabetes mellitus with hyperglycemia: Secondary | ICD-10-CM | POA: Diagnosis not present

## 2023-07-05 DIAGNOSIS — D508 Other iron deficiency anemias: Secondary | ICD-10-CM | POA: Diagnosis not present

## 2023-07-05 DIAGNOSIS — E1140 Type 2 diabetes mellitus with diabetic neuropathy, unspecified: Secondary | ICD-10-CM | POA: Diagnosis not present

## 2023-07-05 DIAGNOSIS — I70223 Atherosclerosis of native arteries of extremities with rest pain, bilateral legs: Secondary | ICD-10-CM | POA: Diagnosis not present

## 2023-07-05 DIAGNOSIS — E559 Vitamin D deficiency, unspecified: Secondary | ICD-10-CM | POA: Diagnosis not present

## 2023-07-05 DIAGNOSIS — E785 Hyperlipidemia, unspecified: Secondary | ICD-10-CM | POA: Diagnosis not present

## 2023-07-05 DIAGNOSIS — E782 Mixed hyperlipidemia: Secondary | ICD-10-CM | POA: Diagnosis not present

## 2023-07-05 DIAGNOSIS — E7849 Other hyperlipidemia: Secondary | ICD-10-CM | POA: Diagnosis not present

## 2023-07-05 DIAGNOSIS — D518 Other vitamin B12 deficiency anemias: Secondary | ICD-10-CM | POA: Diagnosis not present

## 2023-07-05 DIAGNOSIS — F411 Generalized anxiety disorder: Secondary | ICD-10-CM | POA: Diagnosis not present

## 2023-07-05 DIAGNOSIS — E038 Other specified hypothyroidism: Secondary | ICD-10-CM | POA: Diagnosis not present

## 2023-07-05 DIAGNOSIS — E119 Type 2 diabetes mellitus without complications: Secondary | ICD-10-CM | POA: Diagnosis not present

## 2023-07-06 DIAGNOSIS — R7309 Other abnormal glucose: Secondary | ICD-10-CM | POA: Diagnosis not present

## 2023-07-08 DIAGNOSIS — D519 Vitamin B12 deficiency anemia, unspecified: Secondary | ICD-10-CM | POA: Diagnosis not present

## 2023-07-08 DIAGNOSIS — Z79899 Other long term (current) drug therapy: Secondary | ICD-10-CM | POA: Diagnosis not present

## 2023-07-08 DIAGNOSIS — F32A Depression, unspecified: Secondary | ICD-10-CM | POA: Diagnosis not present

## 2023-07-08 DIAGNOSIS — K861 Other chronic pancreatitis: Secondary | ICD-10-CM | POA: Diagnosis not present

## 2023-07-08 DIAGNOSIS — F17200 Nicotine dependence, unspecified, uncomplicated: Secondary | ICD-10-CM | POA: Diagnosis not present

## 2023-07-08 DIAGNOSIS — Z87820 Personal history of traumatic brain injury: Secondary | ICD-10-CM | POA: Diagnosis not present

## 2023-07-08 DIAGNOSIS — Z7982 Long term (current) use of aspirin: Secondary | ICD-10-CM | POA: Diagnosis not present

## 2023-07-08 DIAGNOSIS — W19XXXD Unspecified fall, subsequent encounter: Secondary | ICD-10-CM | POA: Diagnosis not present

## 2023-07-08 DIAGNOSIS — E119 Type 2 diabetes mellitus without complications: Secondary | ICD-10-CM | POA: Diagnosis not present

## 2023-07-08 DIAGNOSIS — Z794 Long term (current) use of insulin: Secondary | ICD-10-CM | POA: Diagnosis not present

## 2023-07-08 DIAGNOSIS — E782 Mixed hyperlipidemia: Secondary | ICD-10-CM | POA: Diagnosis not present

## 2023-07-08 DIAGNOSIS — Z9181 History of falling: Secondary | ICD-10-CM | POA: Diagnosis not present

## 2023-07-08 DIAGNOSIS — Z7984 Long term (current) use of oral hypoglycemic drugs: Secondary | ICD-10-CM | POA: Diagnosis not present

## 2023-07-08 DIAGNOSIS — E038 Other specified hypothyroidism: Secondary | ICD-10-CM | POA: Diagnosis not present

## 2023-07-08 DIAGNOSIS — G40909 Epilepsy, unspecified, not intractable, without status epilepticus: Secondary | ICD-10-CM | POA: Diagnosis not present

## 2023-07-08 DIAGNOSIS — F411 Generalized anxiety disorder: Secondary | ICD-10-CM | POA: Diagnosis not present

## 2023-07-08 DIAGNOSIS — E785 Hyperlipidemia, unspecified: Secondary | ICD-10-CM | POA: Diagnosis not present

## 2023-07-08 DIAGNOSIS — K219 Gastro-esophageal reflux disease without esophagitis: Secondary | ICD-10-CM | POA: Diagnosis not present

## 2023-07-21 DIAGNOSIS — L600 Ingrowing nail: Secondary | ICD-10-CM | POA: Diagnosis not present

## 2023-07-21 DIAGNOSIS — G8310 Monoplegia of lower limb affecting unspecified side: Secondary | ICD-10-CM | POA: Diagnosis not present

## 2023-07-21 DIAGNOSIS — B351 Tinea unguium: Secondary | ICD-10-CM | POA: Diagnosis not present

## 2023-07-21 DIAGNOSIS — M79671 Pain in right foot: Secondary | ICD-10-CM | POA: Diagnosis not present

## 2023-07-21 DIAGNOSIS — E1140 Type 2 diabetes mellitus with diabetic neuropathy, unspecified: Secondary | ICD-10-CM | POA: Diagnosis not present

## 2023-07-21 DIAGNOSIS — M79672 Pain in left foot: Secondary | ICD-10-CM | POA: Diagnosis not present

## 2023-07-21 DIAGNOSIS — Q6671 Congenital pes cavus, right foot: Secondary | ICD-10-CM | POA: Diagnosis not present

## 2023-07-21 DIAGNOSIS — I8393 Asymptomatic varicose veins of bilateral lower extremities: Secondary | ICD-10-CM | POA: Diagnosis not present

## 2023-07-21 DIAGNOSIS — Q6672 Congenital pes cavus, left foot: Secondary | ICD-10-CM | POA: Diagnosis not present

## 2023-07-22 DIAGNOSIS — F32A Depression, unspecified: Secondary | ICD-10-CM | POA: Diagnosis not present

## 2023-07-22 DIAGNOSIS — E785 Hyperlipidemia, unspecified: Secondary | ICD-10-CM | POA: Diagnosis not present

## 2023-07-22 DIAGNOSIS — F411 Generalized anxiety disorder: Secondary | ICD-10-CM | POA: Diagnosis not present

## 2023-07-22 DIAGNOSIS — E1140 Type 2 diabetes mellitus with diabetic neuropathy, unspecified: Secondary | ICD-10-CM | POA: Diagnosis not present

## 2023-07-22 DIAGNOSIS — R531 Weakness: Secondary | ICD-10-CM | POA: Diagnosis not present

## 2023-07-22 DIAGNOSIS — K219 Gastro-esophageal reflux disease without esophagitis: Secondary | ICD-10-CM | POA: Diagnosis not present

## 2023-07-22 DIAGNOSIS — G40909 Epilepsy, unspecified, not intractable, without status epilepticus: Secondary | ICD-10-CM | POA: Diagnosis not present

## 2023-07-22 DIAGNOSIS — S069X0S Unspecified intracranial injury without loss of consciousness, sequela: Secondary | ICD-10-CM | POA: Diagnosis not present

## 2023-08-01 DIAGNOSIS — Z87820 Personal history of traumatic brain injury: Secondary | ICD-10-CM | POA: Diagnosis not present

## 2023-08-01 DIAGNOSIS — F5105 Insomnia due to other mental disorder: Secondary | ICD-10-CM | POA: Diagnosis not present

## 2023-08-01 DIAGNOSIS — F32A Depression, unspecified: Secondary | ICD-10-CM | POA: Diagnosis not present

## 2023-08-01 DIAGNOSIS — F419 Anxiety disorder, unspecified: Secondary | ICD-10-CM | POA: Diagnosis not present

## 2023-08-01 DIAGNOSIS — G454 Transient global amnesia: Secondary | ICD-10-CM | POA: Diagnosis not present

## 2023-08-06 DIAGNOSIS — R7309 Other abnormal glucose: Secondary | ICD-10-CM | POA: Diagnosis not present

## 2023-08-07 DIAGNOSIS — I70223 Atherosclerosis of native arteries of extremities with rest pain, bilateral legs: Secondary | ICD-10-CM | POA: Diagnosis not present

## 2023-08-07 DIAGNOSIS — E785 Hyperlipidemia, unspecified: Secondary | ICD-10-CM | POA: Diagnosis not present

## 2023-08-07 DIAGNOSIS — E119 Type 2 diabetes mellitus without complications: Secondary | ICD-10-CM | POA: Diagnosis not present

## 2023-08-07 DIAGNOSIS — E559 Vitamin D deficiency, unspecified: Secondary | ICD-10-CM | POA: Diagnosis not present

## 2023-08-07 DIAGNOSIS — E7849 Other hyperlipidemia: Secondary | ICD-10-CM | POA: Diagnosis not present

## 2023-08-07 DIAGNOSIS — D508 Other iron deficiency anemias: Secondary | ICD-10-CM | POA: Diagnosis not present

## 2023-08-07 DIAGNOSIS — E1140 Type 2 diabetes mellitus with diabetic neuropathy, unspecified: Secondary | ICD-10-CM | POA: Diagnosis not present

## 2023-08-07 DIAGNOSIS — D518 Other vitamin B12 deficiency anemias: Secondary | ICD-10-CM | POA: Diagnosis not present

## 2023-08-07 DIAGNOSIS — E1165 Type 2 diabetes mellitus with hyperglycemia: Secondary | ICD-10-CM | POA: Diagnosis not present

## 2023-08-07 DIAGNOSIS — E782 Mixed hyperlipidemia: Secondary | ICD-10-CM | POA: Diagnosis not present

## 2023-08-07 DIAGNOSIS — F411 Generalized anxiety disorder: Secondary | ICD-10-CM | POA: Diagnosis not present

## 2023-08-07 DIAGNOSIS — E038 Other specified hypothyroidism: Secondary | ICD-10-CM | POA: Diagnosis not present

## 2023-08-19 DIAGNOSIS — K861 Other chronic pancreatitis: Secondary | ICD-10-CM | POA: Diagnosis not present

## 2023-08-19 DIAGNOSIS — E1140 Type 2 diabetes mellitus with diabetic neuropathy, unspecified: Secondary | ICD-10-CM | POA: Diagnosis not present

## 2023-08-19 DIAGNOSIS — G4700 Insomnia, unspecified: Secondary | ICD-10-CM | POA: Diagnosis not present

## 2023-08-19 DIAGNOSIS — D508 Other iron deficiency anemias: Secondary | ICD-10-CM | POA: Diagnosis not present

## 2023-08-19 DIAGNOSIS — E785 Hyperlipidemia, unspecified: Secondary | ICD-10-CM | POA: Diagnosis not present

## 2023-08-19 DIAGNOSIS — K219 Gastro-esophageal reflux disease without esophagitis: Secondary | ICD-10-CM | POA: Diagnosis not present

## 2023-08-19 DIAGNOSIS — G40909 Epilepsy, unspecified, not intractable, without status epilepticus: Secondary | ICD-10-CM | POA: Diagnosis not present

## 2023-08-19 DIAGNOSIS — K5901 Slow transit constipation: Secondary | ICD-10-CM | POA: Diagnosis not present

## 2023-08-21 DIAGNOSIS — E782 Mixed hyperlipidemia: Secondary | ICD-10-CM | POA: Diagnosis not present

## 2023-08-21 DIAGNOSIS — E7849 Other hyperlipidemia: Secondary | ICD-10-CM | POA: Diagnosis not present

## 2023-08-21 DIAGNOSIS — D508 Other iron deficiency anemias: Secondary | ICD-10-CM | POA: Diagnosis not present

## 2023-08-21 DIAGNOSIS — E119 Type 2 diabetes mellitus without complications: Secondary | ICD-10-CM | POA: Diagnosis not present

## 2023-08-21 DIAGNOSIS — D518 Other vitamin B12 deficiency anemias: Secondary | ICD-10-CM | POA: Diagnosis not present

## 2023-08-21 DIAGNOSIS — E1140 Type 2 diabetes mellitus with diabetic neuropathy, unspecified: Secondary | ICD-10-CM | POA: Diagnosis not present

## 2023-08-21 DIAGNOSIS — I70223 Atherosclerosis of native arteries of extremities with rest pain, bilateral legs: Secondary | ICD-10-CM | POA: Diagnosis not present

## 2023-08-21 DIAGNOSIS — E038 Other specified hypothyroidism: Secondary | ICD-10-CM | POA: Diagnosis not present

## 2023-08-21 DIAGNOSIS — E559 Vitamin D deficiency, unspecified: Secondary | ICD-10-CM | POA: Diagnosis not present

## 2023-08-21 DIAGNOSIS — E785 Hyperlipidemia, unspecified: Secondary | ICD-10-CM | POA: Diagnosis not present

## 2023-08-21 DIAGNOSIS — E1165 Type 2 diabetes mellitus with hyperglycemia: Secondary | ICD-10-CM | POA: Diagnosis not present

## 2023-08-21 DIAGNOSIS — F411 Generalized anxiety disorder: Secondary | ICD-10-CM | POA: Diagnosis not present

## 2023-09-02 DIAGNOSIS — F419 Anxiety disorder, unspecified: Secondary | ICD-10-CM | POA: Diagnosis not present

## 2023-09-02 DIAGNOSIS — F5105 Insomnia due to other mental disorder: Secondary | ICD-10-CM | POA: Diagnosis not present

## 2023-09-02 DIAGNOSIS — G454 Transient global amnesia: Secondary | ICD-10-CM | POA: Diagnosis not present

## 2023-09-02 DIAGNOSIS — F32A Depression, unspecified: Secondary | ICD-10-CM | POA: Diagnosis not present

## 2023-09-02 DIAGNOSIS — Z87820 Personal history of traumatic brain injury: Secondary | ICD-10-CM | POA: Diagnosis not present

## 2023-09-05 DIAGNOSIS — R7309 Other abnormal glucose: Secondary | ICD-10-CM | POA: Diagnosis not present

## 2023-09-16 DIAGNOSIS — E1140 Type 2 diabetes mellitus with diabetic neuropathy, unspecified: Secondary | ICD-10-CM | POA: Diagnosis not present

## 2023-09-16 DIAGNOSIS — E785 Hyperlipidemia, unspecified: Secondary | ICD-10-CM | POA: Diagnosis not present

## 2023-09-16 DIAGNOSIS — E119 Type 2 diabetes mellitus without complications: Secondary | ICD-10-CM | POA: Diagnosis not present

## 2023-09-16 DIAGNOSIS — E7849 Other hyperlipidemia: Secondary | ICD-10-CM | POA: Diagnosis not present

## 2023-09-16 DIAGNOSIS — I70223 Atherosclerosis of native arteries of extremities with rest pain, bilateral legs: Secondary | ICD-10-CM | POA: Diagnosis not present

## 2023-09-16 DIAGNOSIS — K5901 Slow transit constipation: Secondary | ICD-10-CM | POA: Diagnosis not present

## 2023-09-16 DIAGNOSIS — E038 Other specified hypothyroidism: Secondary | ICD-10-CM | POA: Diagnosis not present

## 2023-09-16 DIAGNOSIS — E782 Mixed hyperlipidemia: Secondary | ICD-10-CM | POA: Diagnosis not present

## 2023-09-16 DIAGNOSIS — R42 Dizziness and giddiness: Secondary | ICD-10-CM | POA: Diagnosis not present

## 2023-09-16 DIAGNOSIS — D518 Other vitamin B12 deficiency anemias: Secondary | ICD-10-CM | POA: Diagnosis not present

## 2023-09-16 DIAGNOSIS — E1165 Type 2 diabetes mellitus with hyperglycemia: Secondary | ICD-10-CM | POA: Diagnosis not present

## 2023-09-16 DIAGNOSIS — F32A Depression, unspecified: Secondary | ICD-10-CM | POA: Diagnosis not present

## 2023-09-16 DIAGNOSIS — K219 Gastro-esophageal reflux disease without esophagitis: Secondary | ICD-10-CM | POA: Diagnosis not present

## 2023-09-16 DIAGNOSIS — G4700 Insomnia, unspecified: Secondary | ICD-10-CM | POA: Diagnosis not present

## 2023-09-16 DIAGNOSIS — S069X0S Unspecified intracranial injury without loss of consciousness, sequela: Secondary | ICD-10-CM | POA: Diagnosis not present

## 2023-09-16 DIAGNOSIS — D508 Other iron deficiency anemias: Secondary | ICD-10-CM | POA: Diagnosis not present

## 2023-09-16 DIAGNOSIS — F411 Generalized anxiety disorder: Secondary | ICD-10-CM | POA: Diagnosis not present

## 2023-09-16 DIAGNOSIS — K861 Other chronic pancreatitis: Secondary | ICD-10-CM | POA: Diagnosis not present

## 2023-09-16 DIAGNOSIS — E559 Vitamin D deficiency, unspecified: Secondary | ICD-10-CM | POA: Diagnosis not present

## 2023-09-18 DIAGNOSIS — E038 Other specified hypothyroidism: Secondary | ICD-10-CM | POA: Diagnosis not present

## 2023-09-18 DIAGNOSIS — E119 Type 2 diabetes mellitus without complications: Secondary | ICD-10-CM | POA: Diagnosis not present

## 2023-09-18 DIAGNOSIS — F419 Anxiety disorder, unspecified: Secondary | ICD-10-CM | POA: Diagnosis not present

## 2023-09-18 DIAGNOSIS — F5105 Insomnia due to other mental disorder: Secondary | ICD-10-CM | POA: Diagnosis not present

## 2023-09-18 DIAGNOSIS — Z79899 Other long term (current) drug therapy: Secondary | ICD-10-CM | POA: Diagnosis not present

## 2023-09-18 DIAGNOSIS — E782 Mixed hyperlipidemia: Secondary | ICD-10-CM | POA: Diagnosis not present

## 2023-09-18 DIAGNOSIS — G454 Transient global amnesia: Secondary | ICD-10-CM | POA: Diagnosis not present

## 2023-09-18 DIAGNOSIS — F32A Depression, unspecified: Secondary | ICD-10-CM | POA: Diagnosis not present

## 2023-09-18 DIAGNOSIS — Z87820 Personal history of traumatic brain injury: Secondary | ICD-10-CM | POA: Diagnosis not present

## 2023-09-18 DIAGNOSIS — D519 Vitamin B12 deficiency anemia, unspecified: Secondary | ICD-10-CM | POA: Diagnosis not present

## 2023-09-25 DIAGNOSIS — Z79899 Other long term (current) drug therapy: Secondary | ICD-10-CM | POA: Diagnosis not present

## 2023-10-02 DIAGNOSIS — R739 Hyperglycemia, unspecified: Secondary | ICD-10-CM | POA: Diagnosis not present

## 2023-10-03 DIAGNOSIS — R7309 Other abnormal glucose: Secondary | ICD-10-CM | POA: Diagnosis not present

## 2023-10-14 DIAGNOSIS — G4700 Insomnia, unspecified: Secondary | ICD-10-CM | POA: Diagnosis not present

## 2023-10-14 DIAGNOSIS — G40909 Epilepsy, unspecified, not intractable, without status epilepticus: Secondary | ICD-10-CM | POA: Diagnosis not present

## 2023-10-14 DIAGNOSIS — E1140 Type 2 diabetes mellitus with diabetic neuropathy, unspecified: Secondary | ICD-10-CM | POA: Diagnosis not present

## 2023-10-14 DIAGNOSIS — F32A Depression, unspecified: Secondary | ICD-10-CM | POA: Diagnosis not present

## 2023-10-14 DIAGNOSIS — D508 Other iron deficiency anemias: Secondary | ICD-10-CM | POA: Diagnosis not present

## 2023-10-14 DIAGNOSIS — E785 Hyperlipidemia, unspecified: Secondary | ICD-10-CM | POA: Diagnosis not present

## 2023-10-14 DIAGNOSIS — R42 Dizziness and giddiness: Secondary | ICD-10-CM | POA: Diagnosis not present

## 2023-10-14 DIAGNOSIS — K219 Gastro-esophageal reflux disease without esophagitis: Secondary | ICD-10-CM | POA: Diagnosis not present

## 2023-10-14 DIAGNOSIS — F411 Generalized anxiety disorder: Secondary | ICD-10-CM | POA: Diagnosis not present

## 2023-10-20 DIAGNOSIS — E1165 Type 2 diabetes mellitus with hyperglycemia: Secondary | ICD-10-CM | POA: Diagnosis not present

## 2023-10-20 DIAGNOSIS — E785 Hyperlipidemia, unspecified: Secondary | ICD-10-CM | POA: Diagnosis not present

## 2023-10-20 DIAGNOSIS — D508 Other iron deficiency anemias: Secondary | ICD-10-CM | POA: Diagnosis not present

## 2023-10-20 DIAGNOSIS — E7849 Other hyperlipidemia: Secondary | ICD-10-CM | POA: Diagnosis not present

## 2023-10-20 DIAGNOSIS — F411 Generalized anxiety disorder: Secondary | ICD-10-CM | POA: Diagnosis not present

## 2023-10-20 DIAGNOSIS — E119 Type 2 diabetes mellitus without complications: Secondary | ICD-10-CM | POA: Diagnosis not present

## 2023-10-20 DIAGNOSIS — E782 Mixed hyperlipidemia: Secondary | ICD-10-CM | POA: Diagnosis not present

## 2023-10-20 DIAGNOSIS — E1140 Type 2 diabetes mellitus with diabetic neuropathy, unspecified: Secondary | ICD-10-CM | POA: Diagnosis not present

## 2023-10-20 DIAGNOSIS — E559 Vitamin D deficiency, unspecified: Secondary | ICD-10-CM | POA: Diagnosis not present

## 2023-10-20 DIAGNOSIS — E038 Other specified hypothyroidism: Secondary | ICD-10-CM | POA: Diagnosis not present

## 2023-10-20 DIAGNOSIS — I70223 Atherosclerosis of native arteries of extremities with rest pain, bilateral legs: Secondary | ICD-10-CM | POA: Diagnosis not present

## 2023-10-20 DIAGNOSIS — D518 Other vitamin B12 deficiency anemias: Secondary | ICD-10-CM | POA: Diagnosis not present

## 2023-10-23 DIAGNOSIS — F5105 Insomnia due to other mental disorder: Secondary | ICD-10-CM | POA: Diagnosis not present

## 2023-10-23 DIAGNOSIS — F32A Depression, unspecified: Secondary | ICD-10-CM | POA: Diagnosis not present

## 2023-10-23 DIAGNOSIS — G454 Transient global amnesia: Secondary | ICD-10-CM | POA: Diagnosis not present

## 2023-10-23 DIAGNOSIS — F419 Anxiety disorder, unspecified: Secondary | ICD-10-CM | POA: Diagnosis not present

## 2023-10-23 DIAGNOSIS — Z87820 Personal history of traumatic brain injury: Secondary | ICD-10-CM | POA: Diagnosis not present

## 2023-10-31 DIAGNOSIS — R42 Dizziness and giddiness: Secondary | ICD-10-CM | POA: Diagnosis not present

## 2023-10-31 DIAGNOSIS — I959 Hypotension, unspecified: Secondary | ICD-10-CM | POA: Diagnosis not present

## 2023-11-02 DIAGNOSIS — R9431 Abnormal electrocardiogram [ECG] [EKG]: Secondary | ICD-10-CM | POA: Diagnosis not present

## 2023-11-03 DIAGNOSIS — R7309 Other abnormal glucose: Secondary | ICD-10-CM | POA: Diagnosis not present

## 2023-11-04 DIAGNOSIS — R9431 Abnormal electrocardiogram [ECG] [EKG]: Secondary | ICD-10-CM | POA: Diagnosis not present

## 2023-11-06 DIAGNOSIS — Q6671 Congenital pes cavus, right foot: Secondary | ICD-10-CM | POA: Diagnosis not present

## 2023-11-06 DIAGNOSIS — I8393 Asymptomatic varicose veins of bilateral lower extremities: Secondary | ICD-10-CM | POA: Diagnosis not present

## 2023-11-06 DIAGNOSIS — L600 Ingrowing nail: Secondary | ICD-10-CM | POA: Diagnosis not present

## 2023-11-06 DIAGNOSIS — M79671 Pain in right foot: Secondary | ICD-10-CM | POA: Diagnosis not present

## 2023-11-06 DIAGNOSIS — B351 Tinea unguium: Secondary | ICD-10-CM | POA: Diagnosis not present

## 2023-11-06 DIAGNOSIS — M79672 Pain in left foot: Secondary | ICD-10-CM | POA: Diagnosis not present

## 2023-11-06 DIAGNOSIS — G8310 Monoplegia of lower limb affecting unspecified side: Secondary | ICD-10-CM | POA: Diagnosis not present

## 2023-11-06 DIAGNOSIS — E1140 Type 2 diabetes mellitus with diabetic neuropathy, unspecified: Secondary | ICD-10-CM | POA: Diagnosis not present

## 2023-11-06 DIAGNOSIS — Q6672 Congenital pes cavus, left foot: Secondary | ICD-10-CM | POA: Diagnosis not present

## 2023-11-07 DIAGNOSIS — R55 Syncope and collapse: Secondary | ICD-10-CM | POA: Diagnosis not present

## 2023-11-07 DIAGNOSIS — R42 Dizziness and giddiness: Secondary | ICD-10-CM | POA: Diagnosis not present

## 2023-11-09 DIAGNOSIS — E162 Hypoglycemia, unspecified: Secondary | ICD-10-CM | POA: Diagnosis not present

## 2023-11-10 DIAGNOSIS — I081 Rheumatic disorders of both mitral and tricuspid valves: Secondary | ICD-10-CM | POA: Diagnosis not present

## 2023-11-10 DIAGNOSIS — R42 Dizziness and giddiness: Secondary | ICD-10-CM | POA: Diagnosis not present

## 2023-11-10 DIAGNOSIS — R55 Syncope and collapse: Secondary | ICD-10-CM | POA: Diagnosis not present

## 2023-11-11 DIAGNOSIS — E1140 Type 2 diabetes mellitus with diabetic neuropathy, unspecified: Secondary | ICD-10-CM | POA: Diagnosis not present

## 2023-11-11 DIAGNOSIS — K861 Other chronic pancreatitis: Secondary | ICD-10-CM | POA: Diagnosis not present

## 2023-11-11 DIAGNOSIS — K219 Gastro-esophageal reflux disease without esophagitis: Secondary | ICD-10-CM | POA: Diagnosis not present

## 2023-11-11 DIAGNOSIS — G4700 Insomnia, unspecified: Secondary | ICD-10-CM | POA: Diagnosis not present

## 2023-11-11 DIAGNOSIS — G40909 Epilepsy, unspecified, not intractable, without status epilepticus: Secondary | ICD-10-CM | POA: Diagnosis not present

## 2023-11-11 DIAGNOSIS — D508 Other iron deficiency anemias: Secondary | ICD-10-CM | POA: Diagnosis not present

## 2023-11-11 DIAGNOSIS — F32A Depression, unspecified: Secondary | ICD-10-CM | POA: Diagnosis not present

## 2023-11-11 DIAGNOSIS — S069X0S Unspecified intracranial injury without loss of consciousness, sequela: Secondary | ICD-10-CM | POA: Diagnosis not present

## 2023-11-11 DIAGNOSIS — K5901 Slow transit constipation: Secondary | ICD-10-CM | POA: Diagnosis not present

## 2023-11-11 DIAGNOSIS — F411 Generalized anxiety disorder: Secondary | ICD-10-CM | POA: Diagnosis not present

## 2023-11-20 DIAGNOSIS — E782 Mixed hyperlipidemia: Secondary | ICD-10-CM | POA: Diagnosis not present

## 2023-11-20 DIAGNOSIS — F411 Generalized anxiety disorder: Secondary | ICD-10-CM | POA: Diagnosis not present

## 2023-11-20 DIAGNOSIS — E119 Type 2 diabetes mellitus without complications: Secondary | ICD-10-CM | POA: Diagnosis not present

## 2023-11-20 DIAGNOSIS — E1165 Type 2 diabetes mellitus with hyperglycemia: Secondary | ICD-10-CM | POA: Diagnosis not present

## 2023-11-20 DIAGNOSIS — F32A Depression, unspecified: Secondary | ICD-10-CM | POA: Diagnosis not present

## 2023-11-20 DIAGNOSIS — F419 Anxiety disorder, unspecified: Secondary | ICD-10-CM | POA: Diagnosis not present

## 2023-11-20 DIAGNOSIS — G454 Transient global amnesia: Secondary | ICD-10-CM | POA: Diagnosis not present

## 2023-11-20 DIAGNOSIS — G4700 Insomnia, unspecified: Secondary | ICD-10-CM | POA: Diagnosis not present

## 2023-11-20 DIAGNOSIS — D518 Other vitamin B12 deficiency anemias: Secondary | ICD-10-CM | POA: Diagnosis not present

## 2023-11-20 DIAGNOSIS — I70223 Atherosclerosis of native arteries of extremities with rest pain, bilateral legs: Secondary | ICD-10-CM | POA: Diagnosis not present

## 2023-11-20 DIAGNOSIS — E1140 Type 2 diabetes mellitus with diabetic neuropathy, unspecified: Secondary | ICD-10-CM | POA: Diagnosis not present

## 2023-11-20 DIAGNOSIS — E038 Other specified hypothyroidism: Secondary | ICD-10-CM | POA: Diagnosis not present

## 2023-11-20 DIAGNOSIS — E559 Vitamin D deficiency, unspecified: Secondary | ICD-10-CM | POA: Diagnosis not present

## 2023-11-20 DIAGNOSIS — E7849 Other hyperlipidemia: Secondary | ICD-10-CM | POA: Diagnosis not present

## 2023-11-20 DIAGNOSIS — D508 Other iron deficiency anemias: Secondary | ICD-10-CM | POA: Diagnosis not present

## 2023-11-20 DIAGNOSIS — E785 Hyperlipidemia, unspecified: Secondary | ICD-10-CM | POA: Diagnosis not present

## 2023-11-27 DIAGNOSIS — R55 Syncope and collapse: Secondary | ICD-10-CM | POA: Diagnosis not present

## 2023-11-27 DIAGNOSIS — R42 Dizziness and giddiness: Secondary | ICD-10-CM | POA: Diagnosis not present

## 2023-11-28 DIAGNOSIS — R42 Dizziness and giddiness: Secondary | ICD-10-CM | POA: Diagnosis not present

## 2023-11-28 DIAGNOSIS — S0990XA Unspecified injury of head, initial encounter: Secondary | ICD-10-CM | POA: Diagnosis not present

## 2023-11-28 DIAGNOSIS — E119 Type 2 diabetes mellitus without complications: Secondary | ICD-10-CM | POA: Diagnosis not present

## 2023-11-28 DIAGNOSIS — Z7982 Long term (current) use of aspirin: Secondary | ICD-10-CM | POA: Diagnosis not present

## 2023-11-28 DIAGNOSIS — R58 Hemorrhage, not elsewhere classified: Secondary | ICD-10-CM | POA: Diagnosis not present

## 2023-11-28 DIAGNOSIS — Z794 Long term (current) use of insulin: Secondary | ICD-10-CM | POA: Diagnosis not present

## 2023-11-28 DIAGNOSIS — Z79899 Other long term (current) drug therapy: Secondary | ICD-10-CM | POA: Diagnosis not present

## 2023-11-28 DIAGNOSIS — W19XXXA Unspecified fall, initial encounter: Secondary | ICD-10-CM | POA: Diagnosis not present

## 2023-11-28 DIAGNOSIS — G4489 Other headache syndrome: Secondary | ICD-10-CM | POA: Diagnosis not present

## 2023-11-28 DIAGNOSIS — R2689 Other abnormalities of gait and mobility: Secondary | ICD-10-CM | POA: Diagnosis not present

## 2023-11-28 DIAGNOSIS — S199XXA Unspecified injury of neck, initial encounter: Secondary | ICD-10-CM | POA: Diagnosis not present

## 2023-11-28 DIAGNOSIS — K861 Other chronic pancreatitis: Secondary | ICD-10-CM | POA: Diagnosis not present

## 2023-11-28 DIAGNOSIS — G40909 Epilepsy, unspecified, not intractable, without status epilepticus: Secondary | ICD-10-CM | POA: Diagnosis not present

## 2023-11-28 DIAGNOSIS — K219 Gastro-esophageal reflux disease without esophagitis: Secondary | ICD-10-CM | POA: Diagnosis not present

## 2023-11-28 DIAGNOSIS — E785 Hyperlipidemia, unspecified: Secondary | ICD-10-CM | POA: Diagnosis not present

## 2023-12-01 DIAGNOSIS — F32A Depression, unspecified: Secondary | ICD-10-CM | POA: Diagnosis not present

## 2023-12-01 DIAGNOSIS — Z0000 Encounter for general adult medical examination without abnormal findings: Secondary | ICD-10-CM | POA: Diagnosis not present

## 2023-12-01 DIAGNOSIS — D508 Other iron deficiency anemias: Secondary | ICD-10-CM | POA: Diagnosis not present

## 2023-12-01 DIAGNOSIS — E1140 Type 2 diabetes mellitus with diabetic neuropathy, unspecified: Secondary | ICD-10-CM | POA: Diagnosis not present

## 2023-12-01 DIAGNOSIS — G4700 Insomnia, unspecified: Secondary | ICD-10-CM | POA: Diagnosis not present

## 2023-12-01 DIAGNOSIS — K219 Gastro-esophageal reflux disease without esophagitis: Secondary | ICD-10-CM | POA: Diagnosis not present

## 2023-12-01 DIAGNOSIS — G40909 Epilepsy, unspecified, not intractable, without status epilepticus: Secondary | ICD-10-CM | POA: Diagnosis not present

## 2023-12-01 DIAGNOSIS — S069X0S Unspecified intracranial injury without loss of consciousness, sequela: Secondary | ICD-10-CM | POA: Diagnosis not present

## 2023-12-01 DIAGNOSIS — K5901 Slow transit constipation: Secondary | ICD-10-CM | POA: Diagnosis not present

## 2023-12-01 DIAGNOSIS — K861 Other chronic pancreatitis: Secondary | ICD-10-CM | POA: Diagnosis not present

## 2023-12-01 DIAGNOSIS — F411 Generalized anxiety disorder: Secondary | ICD-10-CM | POA: Diagnosis not present

## 2023-12-03 DIAGNOSIS — R7309 Other abnormal glucose: Secondary | ICD-10-CM | POA: Diagnosis not present

## 2023-12-09 DIAGNOSIS — E785 Hyperlipidemia, unspecified: Secondary | ICD-10-CM | POA: Diagnosis not present

## 2023-12-09 DIAGNOSIS — R739 Hyperglycemia, unspecified: Secondary | ICD-10-CM | POA: Diagnosis not present

## 2023-12-09 DIAGNOSIS — G40909 Epilepsy, unspecified, not intractable, without status epilepticus: Secondary | ICD-10-CM | POA: Diagnosis not present

## 2023-12-09 DIAGNOSIS — D508 Other iron deficiency anemias: Secondary | ICD-10-CM | POA: Diagnosis not present

## 2023-12-09 DIAGNOSIS — E1140 Type 2 diabetes mellitus with diabetic neuropathy, unspecified: Secondary | ICD-10-CM | POA: Diagnosis not present

## 2023-12-09 DIAGNOSIS — K5901 Slow transit constipation: Secondary | ICD-10-CM | POA: Diagnosis not present

## 2023-12-11 DIAGNOSIS — R739 Hyperglycemia, unspecified: Secondary | ICD-10-CM | POA: Diagnosis not present

## 2023-12-11 DIAGNOSIS — E119 Type 2 diabetes mellitus without complications: Secondary | ICD-10-CM | POA: Diagnosis not present

## 2023-12-18 DIAGNOSIS — E038 Other specified hypothyroidism: Secondary | ICD-10-CM | POA: Diagnosis not present

## 2023-12-18 DIAGNOSIS — E1165 Type 2 diabetes mellitus with hyperglycemia: Secondary | ICD-10-CM | POA: Diagnosis not present

## 2023-12-18 DIAGNOSIS — D518 Other vitamin B12 deficiency anemias: Secondary | ICD-10-CM | POA: Diagnosis not present

## 2023-12-18 DIAGNOSIS — E1140 Type 2 diabetes mellitus with diabetic neuropathy, unspecified: Secondary | ICD-10-CM | POA: Diagnosis not present

## 2023-12-18 DIAGNOSIS — E782 Mixed hyperlipidemia: Secondary | ICD-10-CM | POA: Diagnosis not present

## 2023-12-18 DIAGNOSIS — I70223 Atherosclerosis of native arteries of extremities with rest pain, bilateral legs: Secondary | ICD-10-CM | POA: Diagnosis not present

## 2023-12-18 DIAGNOSIS — E7849 Other hyperlipidemia: Secondary | ICD-10-CM | POA: Diagnosis not present

## 2023-12-18 DIAGNOSIS — E785 Hyperlipidemia, unspecified: Secondary | ICD-10-CM | POA: Diagnosis not present

## 2023-12-18 DIAGNOSIS — E559 Vitamin D deficiency, unspecified: Secondary | ICD-10-CM | POA: Diagnosis not present

## 2023-12-18 DIAGNOSIS — E119 Type 2 diabetes mellitus without complications: Secondary | ICD-10-CM | POA: Diagnosis not present

## 2023-12-18 DIAGNOSIS — D508 Other iron deficiency anemias: Secondary | ICD-10-CM | POA: Diagnosis not present

## 2023-12-18 DIAGNOSIS — F411 Generalized anxiety disorder: Secondary | ICD-10-CM | POA: Diagnosis not present

## 2023-12-25 DIAGNOSIS — F419 Anxiety disorder, unspecified: Secondary | ICD-10-CM | POA: Diagnosis not present

## 2023-12-25 DIAGNOSIS — F32A Depression, unspecified: Secondary | ICD-10-CM | POA: Diagnosis not present

## 2023-12-25 DIAGNOSIS — F5104 Psychophysiologic insomnia: Secondary | ICD-10-CM | POA: Diagnosis not present

## 2023-12-25 DIAGNOSIS — Z87820 Personal history of traumatic brain injury: Secondary | ICD-10-CM | POA: Diagnosis not present

## 2023-12-29 DIAGNOSIS — E038 Other specified hypothyroidism: Secondary | ICD-10-CM | POA: Diagnosis not present

## 2023-12-29 DIAGNOSIS — E119 Type 2 diabetes mellitus without complications: Secondary | ICD-10-CM | POA: Diagnosis not present

## 2023-12-29 DIAGNOSIS — E782 Mixed hyperlipidemia: Secondary | ICD-10-CM | POA: Diagnosis not present

## 2023-12-29 DIAGNOSIS — Z79899 Other long term (current) drug therapy: Secondary | ICD-10-CM | POA: Diagnosis not present

## 2023-12-29 DIAGNOSIS — D519 Vitamin B12 deficiency anemia, unspecified: Secondary | ICD-10-CM | POA: Diagnosis not present

## 2023-12-30 DIAGNOSIS — F4323 Adjustment disorder with mixed anxiety and depressed mood: Secondary | ICD-10-CM | POA: Diagnosis not present

## 2024-01-03 DIAGNOSIS — R7309 Other abnormal glucose: Secondary | ICD-10-CM | POA: Diagnosis not present

## 2024-01-06 DIAGNOSIS — E785 Hyperlipidemia, unspecified: Secondary | ICD-10-CM | POA: Diagnosis not present

## 2024-01-06 DIAGNOSIS — D508 Other iron deficiency anemias: Secondary | ICD-10-CM | POA: Diagnosis not present

## 2024-01-06 DIAGNOSIS — R739 Hyperglycemia, unspecified: Secondary | ICD-10-CM | POA: Diagnosis not present

## 2024-01-06 DIAGNOSIS — K219 Gastro-esophageal reflux disease without esophagitis: Secondary | ICD-10-CM | POA: Diagnosis not present

## 2024-01-06 DIAGNOSIS — G40909 Epilepsy, unspecified, not intractable, without status epilepticus: Secondary | ICD-10-CM | POA: Diagnosis not present

## 2024-01-06 DIAGNOSIS — F411 Generalized anxiety disorder: Secondary | ICD-10-CM | POA: Diagnosis not present

## 2024-01-06 DIAGNOSIS — E1140 Type 2 diabetes mellitus with diabetic neuropathy, unspecified: Secondary | ICD-10-CM | POA: Diagnosis not present

## 2024-01-06 DIAGNOSIS — K861 Other chronic pancreatitis: Secondary | ICD-10-CM | POA: Diagnosis not present

## 2024-01-06 DIAGNOSIS — K5901 Slow transit constipation: Secondary | ICD-10-CM | POA: Diagnosis not present

## 2024-01-06 DIAGNOSIS — F32A Depression, unspecified: Secondary | ICD-10-CM | POA: Diagnosis not present

## 2024-01-06 DIAGNOSIS — E119 Type 2 diabetes mellitus without complications: Secondary | ICD-10-CM | POA: Diagnosis not present

## 2024-01-06 DIAGNOSIS — G4700 Insomnia, unspecified: Secondary | ICD-10-CM | POA: Diagnosis not present

## 2024-01-12 DIAGNOSIS — F32A Depression, unspecified: Secondary | ICD-10-CM | POA: Diagnosis not present

## 2024-01-12 DIAGNOSIS — F419 Anxiety disorder, unspecified: Secondary | ICD-10-CM | POA: Diagnosis not present

## 2024-01-12 DIAGNOSIS — F5104 Psychophysiologic insomnia: Secondary | ICD-10-CM | POA: Diagnosis not present

## 2024-01-12 DIAGNOSIS — Z87820 Personal history of traumatic brain injury: Secondary | ICD-10-CM | POA: Diagnosis not present

## 2024-01-25 DIAGNOSIS — K219 Gastro-esophageal reflux disease without esophagitis: Secondary | ICD-10-CM | POA: Diagnosis not present

## 2024-01-25 DIAGNOSIS — E785 Hyperlipidemia, unspecified: Secondary | ICD-10-CM | POA: Diagnosis not present

## 2024-01-25 DIAGNOSIS — F411 Generalized anxiety disorder: Secondary | ICD-10-CM | POA: Diagnosis not present

## 2024-01-25 DIAGNOSIS — D508 Other iron deficiency anemias: Secondary | ICD-10-CM | POA: Diagnosis not present

## 2024-01-25 DIAGNOSIS — E1140 Type 2 diabetes mellitus with diabetic neuropathy, unspecified: Secondary | ICD-10-CM | POA: Diagnosis not present

## 2024-01-26 DIAGNOSIS — E1165 Type 2 diabetes mellitus with hyperglycemia: Secondary | ICD-10-CM | POA: Diagnosis not present

## 2024-01-26 DIAGNOSIS — E038 Other specified hypothyroidism: Secondary | ICD-10-CM | POA: Diagnosis not present

## 2024-01-26 DIAGNOSIS — E782 Mixed hyperlipidemia: Secondary | ICD-10-CM | POA: Diagnosis not present

## 2024-01-26 DIAGNOSIS — D519 Vitamin B12 deficiency anemia, unspecified: Secondary | ICD-10-CM | POA: Diagnosis not present

## 2024-01-26 DIAGNOSIS — E7849 Other hyperlipidemia: Secondary | ICD-10-CM | POA: Diagnosis not present

## 2024-01-26 DIAGNOSIS — D518 Other vitamin B12 deficiency anemias: Secondary | ICD-10-CM | POA: Diagnosis not present

## 2024-01-26 DIAGNOSIS — E559 Vitamin D deficiency, unspecified: Secondary | ICD-10-CM | POA: Diagnosis not present

## 2024-01-26 DIAGNOSIS — E1140 Type 2 diabetes mellitus with diabetic neuropathy, unspecified: Secondary | ICD-10-CM | POA: Diagnosis not present

## 2024-01-26 DIAGNOSIS — E119 Type 2 diabetes mellitus without complications: Secondary | ICD-10-CM | POA: Diagnosis not present

## 2024-01-26 DIAGNOSIS — E785 Hyperlipidemia, unspecified: Secondary | ICD-10-CM | POA: Diagnosis not present

## 2024-01-26 DIAGNOSIS — D508 Other iron deficiency anemias: Secondary | ICD-10-CM | POA: Diagnosis not present

## 2024-02-01 DIAGNOSIS — Z794 Long term (current) use of insulin: Secondary | ICD-10-CM | POA: Diagnosis not present

## 2024-02-01 DIAGNOSIS — E1165 Type 2 diabetes mellitus with hyperglycemia: Secondary | ICD-10-CM | POA: Diagnosis not present

## 2024-02-02 DIAGNOSIS — R7309 Other abnormal glucose: Secondary | ICD-10-CM | POA: Diagnosis not present

## 2024-02-03 DIAGNOSIS — R739 Hyperglycemia, unspecified: Secondary | ICD-10-CM | POA: Diagnosis not present

## 2024-02-03 DIAGNOSIS — D508 Other iron deficiency anemias: Secondary | ICD-10-CM | POA: Diagnosis not present

## 2024-02-03 DIAGNOSIS — S069X0S Unspecified intracranial injury without loss of consciousness, sequela: Secondary | ICD-10-CM | POA: Diagnosis not present

## 2024-02-03 DIAGNOSIS — E119 Type 2 diabetes mellitus without complications: Secondary | ICD-10-CM | POA: Diagnosis not present

## 2024-02-03 DIAGNOSIS — K219 Gastro-esophageal reflux disease without esophagitis: Secondary | ICD-10-CM | POA: Diagnosis not present

## 2024-02-03 DIAGNOSIS — F32A Depression, unspecified: Secondary | ICD-10-CM | POA: Diagnosis not present

## 2024-02-03 DIAGNOSIS — E1140 Type 2 diabetes mellitus with diabetic neuropathy, unspecified: Secondary | ICD-10-CM | POA: Diagnosis not present

## 2024-02-03 DIAGNOSIS — G40909 Epilepsy, unspecified, not intractable, without status epilepticus: Secondary | ICD-10-CM | POA: Diagnosis not present

## 2024-02-03 DIAGNOSIS — F411 Generalized anxiety disorder: Secondary | ICD-10-CM | POA: Diagnosis not present

## 2024-02-03 DIAGNOSIS — K5901 Slow transit constipation: Secondary | ICD-10-CM | POA: Diagnosis not present

## 2024-02-09 DIAGNOSIS — Q6671 Congenital pes cavus, right foot: Secondary | ICD-10-CM | POA: Diagnosis not present

## 2024-02-09 DIAGNOSIS — M79671 Pain in right foot: Secondary | ICD-10-CM | POA: Diagnosis not present

## 2024-02-09 DIAGNOSIS — Q6672 Congenital pes cavus, left foot: Secondary | ICD-10-CM | POA: Diagnosis not present

## 2024-02-09 DIAGNOSIS — G8310 Monoplegia of lower limb affecting unspecified side: Secondary | ICD-10-CM | POA: Diagnosis not present

## 2024-02-09 DIAGNOSIS — L600 Ingrowing nail: Secondary | ICD-10-CM | POA: Diagnosis not present

## 2024-02-09 DIAGNOSIS — I8393 Asymptomatic varicose veins of bilateral lower extremities: Secondary | ICD-10-CM | POA: Diagnosis not present

## 2024-02-09 DIAGNOSIS — E1140 Type 2 diabetes mellitus with diabetic neuropathy, unspecified: Secondary | ICD-10-CM | POA: Diagnosis not present

## 2024-02-09 DIAGNOSIS — B351 Tinea unguium: Secondary | ICD-10-CM | POA: Diagnosis not present

## 2024-02-09 DIAGNOSIS — M79672 Pain in left foot: Secondary | ICD-10-CM | POA: Diagnosis not present

## 2024-02-10 DIAGNOSIS — Z794 Long term (current) use of insulin: Secondary | ICD-10-CM | POA: Diagnosis not present

## 2024-02-10 DIAGNOSIS — E1140 Type 2 diabetes mellitus with diabetic neuropathy, unspecified: Secondary | ICD-10-CM | POA: Diagnosis not present

## 2024-02-19 DIAGNOSIS — R945 Abnormal results of liver function studies: Secondary | ICD-10-CM | POA: Diagnosis not present

## 2024-02-19 DIAGNOSIS — Z87820 Personal history of traumatic brain injury: Secondary | ICD-10-CM | POA: Diagnosis not present

## 2024-02-19 DIAGNOSIS — F419 Anxiety disorder, unspecified: Secondary | ICD-10-CM | POA: Diagnosis not present

## 2024-02-19 DIAGNOSIS — F32A Depression, unspecified: Secondary | ICD-10-CM | POA: Diagnosis not present

## 2024-02-19 DIAGNOSIS — F5104 Psychophysiologic insomnia: Secondary | ICD-10-CM | POA: Diagnosis not present

## 2024-02-20 DIAGNOSIS — E1165 Type 2 diabetes mellitus with hyperglycemia: Secondary | ICD-10-CM | POA: Diagnosis not present

## 2024-02-20 DIAGNOSIS — E559 Vitamin D deficiency, unspecified: Secondary | ICD-10-CM | POA: Diagnosis not present

## 2024-02-20 DIAGNOSIS — D519 Vitamin B12 deficiency anemia, unspecified: Secondary | ICD-10-CM | POA: Diagnosis not present

## 2024-02-20 DIAGNOSIS — E785 Hyperlipidemia, unspecified: Secondary | ICD-10-CM | POA: Diagnosis not present

## 2024-02-20 DIAGNOSIS — D518 Other vitamin B12 deficiency anemias: Secondary | ICD-10-CM | POA: Diagnosis not present

## 2024-02-20 DIAGNOSIS — E119 Type 2 diabetes mellitus without complications: Secondary | ICD-10-CM | POA: Diagnosis not present

## 2024-02-20 DIAGNOSIS — E782 Mixed hyperlipidemia: Secondary | ICD-10-CM | POA: Diagnosis not present

## 2024-02-20 DIAGNOSIS — E7849 Other hyperlipidemia: Secondary | ICD-10-CM | POA: Diagnosis not present

## 2024-02-20 DIAGNOSIS — E1140 Type 2 diabetes mellitus with diabetic neuropathy, unspecified: Secondary | ICD-10-CM | POA: Diagnosis not present

## 2024-02-20 DIAGNOSIS — D508 Other iron deficiency anemias: Secondary | ICD-10-CM | POA: Diagnosis not present

## 2024-02-20 DIAGNOSIS — E038 Other specified hypothyroidism: Secondary | ICD-10-CM | POA: Diagnosis not present

## 2024-03-02 DIAGNOSIS — S069X0S Unspecified intracranial injury without loss of consciousness, sequela: Secondary | ICD-10-CM | POA: Diagnosis not present

## 2024-03-02 DIAGNOSIS — K760 Fatty (change of) liver, not elsewhere classified: Secondary | ICD-10-CM | POA: Diagnosis not present

## 2024-03-02 DIAGNOSIS — G40909 Epilepsy, unspecified, not intractable, without status epilepticus: Secondary | ICD-10-CM | POA: Diagnosis not present

## 2024-03-02 DIAGNOSIS — E1140 Type 2 diabetes mellitus with diabetic neuropathy, unspecified: Secondary | ICD-10-CM | POA: Diagnosis not present

## 2024-03-03 DIAGNOSIS — F5105 Insomnia due to other mental disorder: Secondary | ICD-10-CM | POA: Diagnosis not present

## 2024-03-03 DIAGNOSIS — Z87820 Personal history of traumatic brain injury: Secondary | ICD-10-CM | POA: Diagnosis not present

## 2024-03-03 DIAGNOSIS — F99 Mental disorder, not otherwise specified: Secondary | ICD-10-CM | POA: Diagnosis not present

## 2024-03-03 DIAGNOSIS — F411 Generalized anxiety disorder: Secondary | ICD-10-CM | POA: Diagnosis not present

## 2024-03-04 DIAGNOSIS — R7309 Other abnormal glucose: Secondary | ICD-10-CM | POA: Diagnosis not present

## 2024-03-19 DIAGNOSIS — I70223 Atherosclerosis of native arteries of extremities with rest pain, bilateral legs: Secondary | ICD-10-CM | POA: Diagnosis not present

## 2024-03-19 DIAGNOSIS — E1165 Type 2 diabetes mellitus with hyperglycemia: Secondary | ICD-10-CM | POA: Diagnosis not present

## 2024-03-19 DIAGNOSIS — E782 Mixed hyperlipidemia: Secondary | ICD-10-CM | POA: Diagnosis not present

## 2024-03-19 DIAGNOSIS — E038 Other specified hypothyroidism: Secondary | ICD-10-CM | POA: Diagnosis not present

## 2024-03-19 DIAGNOSIS — F411 Generalized anxiety disorder: Secondary | ICD-10-CM | POA: Diagnosis not present

## 2024-03-19 DIAGNOSIS — D508 Other iron deficiency anemias: Secondary | ICD-10-CM | POA: Diagnosis not present

## 2024-03-19 DIAGNOSIS — D518 Other vitamin B12 deficiency anemias: Secondary | ICD-10-CM | POA: Diagnosis not present

## 2024-03-19 DIAGNOSIS — E785 Hyperlipidemia, unspecified: Secondary | ICD-10-CM | POA: Diagnosis not present

## 2024-03-19 DIAGNOSIS — E1140 Type 2 diabetes mellitus with diabetic neuropathy, unspecified: Secondary | ICD-10-CM | POA: Diagnosis not present

## 2024-03-19 DIAGNOSIS — E7849 Other hyperlipidemia: Secondary | ICD-10-CM | POA: Diagnosis not present

## 2024-03-19 DIAGNOSIS — E559 Vitamin D deficiency, unspecified: Secondary | ICD-10-CM | POA: Diagnosis not present

## 2024-03-19 DIAGNOSIS — E119 Type 2 diabetes mellitus without complications: Secondary | ICD-10-CM | POA: Diagnosis not present

## 2024-03-30 DIAGNOSIS — E1140 Type 2 diabetes mellitus with diabetic neuropathy, unspecified: Secondary | ICD-10-CM | POA: Diagnosis not present

## 2024-03-30 DIAGNOSIS — S069X0S Unspecified intracranial injury without loss of consciousness, sequela: Secondary | ICD-10-CM | POA: Diagnosis not present

## 2024-03-30 DIAGNOSIS — G40909 Epilepsy, unspecified, not intractable, without status epilepticus: Secondary | ICD-10-CM | POA: Diagnosis not present

## 2024-03-30 DIAGNOSIS — E785 Hyperlipidemia, unspecified: Secondary | ICD-10-CM | POA: Diagnosis not present

## 2024-03-30 DIAGNOSIS — K861 Other chronic pancreatitis: Secondary | ICD-10-CM | POA: Diagnosis not present

## 2024-04-01 DIAGNOSIS — Z87820 Personal history of traumatic brain injury: Secondary | ICD-10-CM | POA: Diagnosis not present

## 2024-04-01 DIAGNOSIS — F411 Generalized anxiety disorder: Secondary | ICD-10-CM | POA: Diagnosis not present

## 2024-04-01 DIAGNOSIS — F32A Depression, unspecified: Secondary | ICD-10-CM | POA: Diagnosis not present

## 2024-04-01 DIAGNOSIS — G4700 Insomnia, unspecified: Secondary | ICD-10-CM | POA: Diagnosis not present

## 2024-04-04 DIAGNOSIS — R7309 Other abnormal glucose: Secondary | ICD-10-CM | POA: Diagnosis not present

## 2024-04-08 DIAGNOSIS — Z79899 Other long term (current) drug therapy: Secondary | ICD-10-CM | POA: Diagnosis not present

## 2024-04-08 DIAGNOSIS — E559 Vitamin D deficiency, unspecified: Secondary | ICD-10-CM | POA: Diagnosis not present

## 2024-04-08 DIAGNOSIS — D519 Vitamin B12 deficiency anemia, unspecified: Secondary | ICD-10-CM | POA: Diagnosis not present

## 2024-04-08 DIAGNOSIS — E038 Other specified hypothyroidism: Secondary | ICD-10-CM | POA: Diagnosis not present

## 2024-04-08 DIAGNOSIS — E119 Type 2 diabetes mellitus without complications: Secondary | ICD-10-CM | POA: Diagnosis not present

## 2024-04-08 DIAGNOSIS — R972 Elevated prostate specific antigen [PSA]: Secondary | ICD-10-CM | POA: Diagnosis not present

## 2024-04-08 DIAGNOSIS — E782 Mixed hyperlipidemia: Secondary | ICD-10-CM | POA: Diagnosis not present

## 2024-04-13 DIAGNOSIS — E119 Type 2 diabetes mellitus without complications: Secondary | ICD-10-CM | POA: Diagnosis not present

## 2024-04-13 DIAGNOSIS — Z79899 Other long term (current) drug therapy: Secondary | ICD-10-CM | POA: Diagnosis not present

## 2024-04-29 DIAGNOSIS — Z87820 Personal history of traumatic brain injury: Secondary | ICD-10-CM | POA: Diagnosis not present

## 2024-04-29 DIAGNOSIS — F411 Generalized anxiety disorder: Secondary | ICD-10-CM | POA: Diagnosis not present

## 2024-04-29 DIAGNOSIS — F5105 Insomnia due to other mental disorder: Secondary | ICD-10-CM | POA: Diagnosis not present

## 2024-04-29 DIAGNOSIS — F32A Depression, unspecified: Secondary | ICD-10-CM | POA: Diagnosis not present

## 2024-05-04 DIAGNOSIS — R7309 Other abnormal glucose: Secondary | ICD-10-CM | POA: Diagnosis not present

## 2024-05-07 DIAGNOSIS — E1165 Type 2 diabetes mellitus with hyperglycemia: Secondary | ICD-10-CM | POA: Diagnosis not present

## 2024-05-07 DIAGNOSIS — E1140 Type 2 diabetes mellitus with diabetic neuropathy, unspecified: Secondary | ICD-10-CM | POA: Diagnosis not present

## 2024-05-07 DIAGNOSIS — E782 Mixed hyperlipidemia: Secondary | ICD-10-CM | POA: Diagnosis not present

## 2024-05-07 DIAGNOSIS — E559 Vitamin D deficiency, unspecified: Secondary | ICD-10-CM | POA: Diagnosis not present

## 2024-05-07 DIAGNOSIS — E119 Type 2 diabetes mellitus without complications: Secondary | ICD-10-CM | POA: Diagnosis not present

## 2024-05-07 DIAGNOSIS — F411 Generalized anxiety disorder: Secondary | ICD-10-CM | POA: Diagnosis not present

## 2024-05-07 DIAGNOSIS — I70223 Atherosclerosis of native arteries of extremities with rest pain, bilateral legs: Secondary | ICD-10-CM | POA: Diagnosis not present

## 2024-05-07 DIAGNOSIS — E038 Other specified hypothyroidism: Secondary | ICD-10-CM | POA: Diagnosis not present

## 2024-05-07 DIAGNOSIS — D518 Other vitamin B12 deficiency anemias: Secondary | ICD-10-CM | POA: Diagnosis not present

## 2024-05-07 DIAGNOSIS — E7849 Other hyperlipidemia: Secondary | ICD-10-CM | POA: Diagnosis not present

## 2024-05-07 DIAGNOSIS — D508 Other iron deficiency anemias: Secondary | ICD-10-CM | POA: Diagnosis not present

## 2024-05-07 DIAGNOSIS — E785 Hyperlipidemia, unspecified: Secondary | ICD-10-CM | POA: Diagnosis not present

## 2024-05-13 DIAGNOSIS — M79671 Pain in right foot: Secondary | ICD-10-CM | POA: Diagnosis not present

## 2024-05-13 DIAGNOSIS — M79672 Pain in left foot: Secondary | ICD-10-CM | POA: Diagnosis not present

## 2024-05-13 DIAGNOSIS — Q6672 Congenital pes cavus, left foot: Secondary | ICD-10-CM | POA: Diagnosis not present

## 2024-05-13 DIAGNOSIS — G8310 Monoplegia of lower limb affecting unspecified side: Secondary | ICD-10-CM | POA: Diagnosis not present

## 2024-05-13 DIAGNOSIS — I8393 Asymptomatic varicose veins of bilateral lower extremities: Secondary | ICD-10-CM | POA: Diagnosis not present

## 2024-05-13 DIAGNOSIS — B351 Tinea unguium: Secondary | ICD-10-CM | POA: Diagnosis not present

## 2024-05-13 DIAGNOSIS — E1140 Type 2 diabetes mellitus with diabetic neuropathy, unspecified: Secondary | ICD-10-CM | POA: Diagnosis not present

## 2024-05-13 DIAGNOSIS — Q6671 Congenital pes cavus, right foot: Secondary | ICD-10-CM | POA: Diagnosis not present

## 2024-05-13 DIAGNOSIS — L600 Ingrowing nail: Secondary | ICD-10-CM | POA: Diagnosis not present

## 2024-05-18 DIAGNOSIS — F4323 Adjustment disorder with mixed anxiety and depressed mood: Secondary | ICD-10-CM | POA: Diagnosis not present

## 2024-05-18 DIAGNOSIS — D518 Other vitamin B12 deficiency anemias: Secondary | ICD-10-CM | POA: Diagnosis not present

## 2024-05-18 DIAGNOSIS — F411 Generalized anxiety disorder: Secondary | ICD-10-CM | POA: Diagnosis not present

## 2024-05-18 DIAGNOSIS — E782 Mixed hyperlipidemia: Secondary | ICD-10-CM | POA: Diagnosis not present

## 2024-05-18 DIAGNOSIS — E1165 Type 2 diabetes mellitus with hyperglycemia: Secondary | ICD-10-CM | POA: Diagnosis not present

## 2024-05-18 DIAGNOSIS — E038 Other specified hypothyroidism: Secondary | ICD-10-CM | POA: Diagnosis not present

## 2024-05-18 DIAGNOSIS — E559 Vitamin D deficiency, unspecified: Secondary | ICD-10-CM | POA: Diagnosis not present

## 2024-05-18 DIAGNOSIS — E785 Hyperlipidemia, unspecified: Secondary | ICD-10-CM | POA: Diagnosis not present

## 2024-05-18 DIAGNOSIS — I70223 Atherosclerosis of native arteries of extremities with rest pain, bilateral legs: Secondary | ICD-10-CM | POA: Diagnosis not present

## 2024-05-18 DIAGNOSIS — D508 Other iron deficiency anemias: Secondary | ICD-10-CM | POA: Diagnosis not present

## 2024-05-18 DIAGNOSIS — E119 Type 2 diabetes mellitus without complications: Secondary | ICD-10-CM | POA: Diagnosis not present

## 2024-05-18 DIAGNOSIS — E1140 Type 2 diabetes mellitus with diabetic neuropathy, unspecified: Secondary | ICD-10-CM | POA: Diagnosis not present

## 2024-05-18 DIAGNOSIS — E7849 Other hyperlipidemia: Secondary | ICD-10-CM | POA: Diagnosis not present

## 2024-05-20 DIAGNOSIS — R296 Repeated falls: Secondary | ICD-10-CM | POA: Diagnosis not present

## 2024-05-20 DIAGNOSIS — Z87820 Personal history of traumatic brain injury: Secondary | ICD-10-CM | POA: Diagnosis not present

## 2024-05-20 DIAGNOSIS — F32A Depression, unspecified: Secondary | ICD-10-CM | POA: Diagnosis not present

## 2024-05-20 DIAGNOSIS — F411 Generalized anxiety disorder: Secondary | ICD-10-CM | POA: Diagnosis not present

## 2024-05-20 DIAGNOSIS — F5105 Insomnia due to other mental disorder: Secondary | ICD-10-CM | POA: Diagnosis not present

## 2024-05-25 DIAGNOSIS — E1140 Type 2 diabetes mellitus with diabetic neuropathy, unspecified: Secondary | ICD-10-CM | POA: Diagnosis not present

## 2024-05-25 DIAGNOSIS — E785 Hyperlipidemia, unspecified: Secondary | ICD-10-CM | POA: Diagnosis not present

## 2024-05-25 DIAGNOSIS — S069X0S Unspecified intracranial injury without loss of consciousness, sequela: Secondary | ICD-10-CM | POA: Diagnosis not present

## 2024-05-25 DIAGNOSIS — G40909 Epilepsy, unspecified, not intractable, without status epilepticus: Secondary | ICD-10-CM | POA: Diagnosis not present
